# Patient Record
Sex: Female | Born: 1945 | Race: White | Hispanic: No | Marital: Married | Smoking: Never smoker
Health system: Southern US, Community
[De-identification: ages and names within clinical notes are randomized; demographics above are authoritative.]

## PROBLEM LIST (undated history)

## (undated) DIAGNOSIS — Z800 Family history of malignant neoplasm of digestive organs: Secondary | ICD-10-CM

## (undated) DIAGNOSIS — L719 Rosacea, unspecified: Secondary | ICD-10-CM

## (undated) DIAGNOSIS — C50911 Malignant neoplasm of unspecified site of right female breast: Secondary | ICD-10-CM

## (undated) DIAGNOSIS — I10 Essential (primary) hypertension: Secondary | ICD-10-CM

## (undated) DIAGNOSIS — C541 Malignant neoplasm of endometrium: Secondary | ICD-10-CM

## (undated) HISTORY — DX: Family history of malignant neoplasm of digestive organs: Z80.0

---

## 1973-01-06 HISTORY — PX: DILATION AND CURETTAGE OF UTERUS: SHX78

## 2005-01-06 HISTORY — PX: BREAST BIOPSY: SHX20

## 2014-01-06 DIAGNOSIS — C541 Malignant neoplasm of endometrium: Secondary | ICD-10-CM

## 2014-01-06 HISTORY — DX: Malignant neoplasm of endometrium: C54.1

## 2014-04-07 HISTORY — PX: ROBOTIC ASSISTED TOTAL HYSTERECTOMY WITH BILATERAL SALPINGO OOPHERECTOMY: SHX6086

## 2016-12-06 HISTORY — PX: BREAST BIOPSY: SHX20

## 2017-01-14 ENCOUNTER — Other Ambulatory Visit: Payer: Self-pay | Admitting: Surgery

## 2017-01-14 DIAGNOSIS — N6310 Unspecified lump in the right breast, unspecified quadrant: Secondary | ICD-10-CM

## 2017-01-15 ENCOUNTER — Other Ambulatory Visit: Payer: Self-pay

## 2017-01-16 ENCOUNTER — Other Ambulatory Visit: Payer: Self-pay

## 2017-01-16 ENCOUNTER — Ambulatory Visit
Admission: RE | Admit: 2017-01-16 | Discharge: 2017-01-16 | Disposition: A | Discharge status: Home / Self Care | Payer: Medicare Other | Source: Ambulatory Visit | Attending: Surgery | Admitting: Surgery

## 2017-01-16 DIAGNOSIS — N6310 Unspecified lump in the right breast, unspecified quadrant: Secondary | ICD-10-CM

## 2017-01-16 MED ORDER — GADOBENATE DIMEGLUMINE 529 MG/ML IV SOLN
15.0000 mL | Freq: Once | INTRAVENOUS | Status: AC | PRN
  Administered 2017-01-16: 15 mL via INTRAVENOUS

## 2017-01-19 ENCOUNTER — Ambulatory Visit: Payer: Self-pay | Admitting: Surgery

## 2017-01-19 DIAGNOSIS — C50911 Malignant neoplasm of unspecified site of right female breast: Secondary | ICD-10-CM

## 2017-01-19 NOTE — H&P (Signed)
Doris Maynard Documented: 01/19/2017 2:21 PM Location: [facility] Patient #: [id number] DOB: 01/03/1946 Married / Language: Cleophus Molt / Race: White Female  History of Present Illness Marcello Moores A. Aidian Salomon MD; 01/19/2017 4:00 PM) Patient words: Patient sent at the request of Dr. Hinda Kehr for abnormal screening mammogram. Patient noted to have 2 densities in the right breast in the upper outer and upper quadrants. 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 The area in the right upper-outer quadrant breast measures 3 cm the area in the right upper inner quadrant measures 2 cm. There are 2 other satellite nodules adjacent to these areas. Left breast was normal. Patient has no family history of breast cancer. Patient denies any history of nipple discharge breast mass or breast pain.                       CLINICAL DATA: Recent diagnosis of right breast cancer at 2 sites, 1 o'clock axis (mass) and 10 o'clock axis (mass with distortion).  History of benign left breast biopsy in 2007, fibroadenoma.  LABS: Creatinine 0.9, GFR 65  EXAM: BILATERAL BREAST MRI WITH AND WITHOUT CONTRAST  TECHNIQUE: Multiplanar, multisequence MR images of both breasts were obtained prior to and following the intravenous administration of 15 ml of MultiHance.  THREE-DIMENSIONAL MR IMAGE RENDERING ON INDEPENDENT WORKSTATION:  Three-dimensional MR images were rendered by post-processing of the original MR data on an independent workstation. The three-dimensional MR images were interpreted, and findings are reported in the following complete MRI report for this study. Three dimensional images were evaluated at the independent DynaCad workstation  COMPARISON: Previous exams including diagnostic mammogram and ultrasound dated 12/19/2016, ultrasound guided biopsy dated 01/01/2017  and postprocedure mammogram dated 01/01/2017.  FINDINGS: Breast composition: c. Heterogeneous fibroglandular tissue.  Background parenchymal enhancement: Moderate.  Right breast: Biopsy-proven invasive mammary carcinoma with associated distortion in the upper-outer quadrant of the right breast, 10 o'clock axis by ultrasound, measuring 3.3 x 0.9 cm (AP by transverse dimensions), with associated biopsy clip artifact (series 6, images 78 through 86).  Second biopsy-proven invasive carcinoma within the upper inner quadrant of the right breast, 1 o'clock axis by ultrasound, measuring 2 x 1.4 cm, with associated biopsy clip(series 6, images 58 through 64).  Additional irregular enhancing mass within the slightly inner right breast, at middle depth, measuring 5 mm, with persistent enhancement kinetics, located approximately 1.5 cm medial to the biopsy-proven cancer at the 10 o'clock axis, suspected satellite lesion.  Additional irregular enhancing mass within the upper inner quadrant of the right breast, at anterior depth, 2-3 o'clock axis, measuring 7 mm, with persistent enhancement kinetics (series 6, image 83).  Left breast: No suspicious enhancing mass, non mass enhancement or secondary signs of malignancy are identified within the left breast.  Lymph nodes: No enlarged or morphologically abnormal lymph nodes are seen within either axillary region or internal mammary chain regions.  Ancillary findings: None.  IMPRESSION: 1. Biopsy-proven multicentric invasive carcinomas within the RIGHT breast at the 10 o'clock and 1 o'clock axes, measuring 3.3 cm and 2 cm respectively, with associated biopsy clip artifacts. 2. Additional suspicious enhancing mass within the slightly inner RIGHT breast, at middle depth, measuring 5 mm, located approximately 1.5 cm medial to the biopsy-proven cancer at the 10 o'clock axis, suspected satellite lesion. If breast conservation surgery  is considered for the right breast, recommend MRI guided biopsy of  this mass. 3. Additional suspicious enhancing mass within the upper inner quadrant of the RIGHT breast, at anterior depth, 2-3 o'clock axis, measuring 7 mm. If breast conservation surgery is considered for the right breast, recommend MRI guided biopsy of this mass as well. 4. No evidence of malignancy within the LEFT breast.  RECOMMENDATION: If breast conservation is considered for the RIGHT breast, recommend additional MRI guided biopsies of the 2 additional masses within the RIGHT breast, as detailed above, which are suspicious for additional multicentric disease.  BI-RADS CATEGORY 4: Suspicious.   Electronically Signed By: Franki Cabot M.D. On: 01/19/2017 08:57.  The patient is a 72 year old female.   Past Surgical History (Janette Ranson, CMA; 01/19/2017 2:21 PM) Hysterectomy (due to cancer) - Complete  Diagnostic Studies History (Janette Ranson, CMA; 01/19/2017 2:21 PM) Colonoscopy 1-5 years ago Mammogram within last year  Allergies (Janette Ranson, CMA; 01/19/2017 2:22 PM) Clindamycin HCl *ANTI-INFECTIVE AGENTS - MISC.*  Medication History (Janette Ranson, CMA; 01/19/2017 2:24 PM) Lisinopril (20MG Tablet, Oral) Active. MetroNIDAZOLE (0.75% Cream, External) Active.  Pregnancy / Birth History (Janette Ranson, CMA; 01/19/2017 2:21 PM) Age at menarche 31 years. Age of menopause 51-55 Contraceptive History Oral contraceptives. Gravida 3 Length (months) of breastfeeding 3-6 Maternal age 60-30 Para 2  Other Problems (Janette Ranson, CMA; 01/19/2017 2:21 PM) Cancer High blood pressure     Review of Systems (Janette Ranson CMA; 01/19/2017 2:21 PM) General Not Present- Appetite Loss, Chills, Fatigue, Fever, Night Sweats, Weight Gain and Weight Loss. Skin Not Present- Change in Wart/Mole, Dryness, Hives, Jaundice, New Lesions, Non-Healing Wounds, Rash and Ulcer. HEENT Not Present-  Earache, Hearing Loss, Hoarseness, Nose Bleed, Oral Ulcers, Ringing in the Ears, Seasonal Allergies, Sinus Pain, Sore Throat, Visual Disturbances, Wears glasses/contact lenses and Yellow Eyes. Respiratory Not Present- Bloody sputum, Chronic Cough, Difficulty Breathing, Snoring and Wheezing. Breast Not Present- Breast Mass, Breast Pain, Nipple Discharge and Skin Changes. Cardiovascular Not Present- Chest Pain, Difficulty Breathing Lying Down, Leg Cramps, Palpitations, Rapid Heart Rate, Shortness of Breath and Swelling of Extremities. Gastrointestinal Not Present- Abdominal Pain, Bloating, Bloody Stool, Change in Bowel Habits, Chronic diarrhea, Constipation, Difficulty Swallowing, Excessive gas, Gets full quickly at meals, Hemorrhoids, Indigestion, Nausea, Rectal Pain and Vomiting. Female Genitourinary Not Present- Frequency, Nocturia, Painful Urination, Pelvic Pain and Urgency. Neurological Not Present- Decreased Memory, Fainting, Headaches, Numbness, Seizures, Tingling, Tremor, Trouble walking and Weakness. Psychiatric Not Present- Anxiety, Bipolar, Change in Sleep Pattern, Depression, Fearful and Frequent crying. Endocrine Not Present- Cold Intolerance, Excessive Hunger, Hair Changes, Heat Intolerance, Hot flashes and New Diabetes. Hematology Not Present- Blood Thinners, Easy Bruising, Excessive bleeding, Gland problems, HIV and Persistent Infections.  Vitals (Janette Ranson CMA; 01/19/2017 2:25 PM) 01/19/2017 2:24 PM Weight: 169 lb Height: 65in Body Surface Area: 1.84 m Body Mass Index: 28.12 kg/m  Pulse: 118 (Regular)  BP: 128/84 (Sitting, Left Arm, Standard)      Physical Exam (Bryann Mcnealy A. Ruvi Fullenwider MD; 01/19/2017 4:01 PM)  General Mental Status-Alert. General Appearance-Consistent with stated age. Hydration-Well hydrated. Voice-Normal.  Head and Neck Head-normocephalic, atraumatic with no lesions or palpable masses. Trachea-midline. Thyroid Gland Characteristics  - normal size and consistency.  Eye Eyeball - Bilateral-Extraocular movements intact. Sclera/Conjunctiva - Bilateral-No scleral icterus.  Chest and Lung Exam Chest and lung exam reveals -quiet, even and easy respiratory effort with no use of accessory muscles and on auscultation, normal breath sounds, no adventitious sounds and normal vocal resonance. Inspection Chest Wall - Normal. Back - normal.  Breast Breast - Left-Symmetric, Non Tender,  No Biopsy scars, no Dimpling, No Inflammation, No Lumpectomy scars, No Mastectomy scars, No Peau d' Orange. Breast - Right-Symmetric, Non Tender, No Biopsy scars, no Dimpling, No Inflammation, No Lumpectomy scars, No Mastectomy scars, No Peau d' Orange. Breast Lump-No Palpable Breast Mass. Note: bruising right breast minimal  Cardiovascular Cardiovascular examination reveals -normal heart sounds, regular rate and rhythm with no murmurs and normal pedal pulses bilaterally.  Abdomen Inspection Inspection of the abdomen reveals - No Hernias. Skin - Scar - no surgical scars. Palpation/Percussion Palpation and Percussion of the abdomen reveal - Soft, Non Tender, No Rebound tenderness, No Rigidity (guarding) and No hepatosplenomegaly. Auscultation Auscultation of the abdomen reveals - Bowel sounds normal.  Neurologic Neurologic evaluation reveals -alert and oriented x 3 with no impairment of recent or remote memory. Mental Status-Normal.  Musculoskeletal Normal Exam - Left-Upper Extremity Strength Normal and Lower Extremity Strength Normal. Normal Exam - Right-Upper Extremity Strength Normal and Lower Extremity Strength Normal.  Lymphatic Head & Neck  General Head & Neck Lymphatics: Bilateral - Description - Normal. Axillary  General Axillary Region: Bilateral - Description - Normal. Tenderness - Non Tender. Femoral & Inguinal  Generalized Femoral & Inguinal Lymphatics: Bilateral - Description - Normal. Tenderness -  Non Tender.    Assessment & Plan (Brean Carberry A. Jules Vidovich MD; 01/19/2017 4:01 PM)  BREAST CANCER, STAGE 2, RIGHT (C50.911) Impression: I'll plan for right breast cancer. This appears to be globular. 2 lumpectomies would deplete at least half of her breast volume and cosmetically would be difficult. Did Discussed treatment options for breast cancer to include breast conservation vs mastectomy with reconstruction. Pt has decided on mastectomy. Risk include bleeding, infection, flap necrosis, pain, numbness, recurrence, hematoma, other surgery needs. Pt understands and agrees to proceed. Risk of sentinel lymph node mapping include bleeding, infection, lymphedema, shoulder pain. stiffness, dye allergy. cosmetic deformity , blood clots, death, need for more surgery. Pt agres to proceed. offer reconstruction but she has no interest. The patient would like to proceed with right simple mastectomy with lymph node mapping.  Current Plans You are being scheduled for surgery- Our schedulers will call you.  You should hear from our office's scheduling department within 5 working days about the location, date, and time of surgery. We try to make accommodations for patient's preferences in scheduling surgery, but sometimes the OR schedule or the surgeon's schedule prevents [place] from making those accommodations.  If you have not heard from our office [phone]) in 5 working days, call the office and ask for your surgeon's nurse.  If you have other questions about your diagnosis, plan, or surgery, call the office and ask for your surgeon's nurse.  Pt Education - [facility] Breast Cancer Information Given - [facility] "Breast Journey" Package Pt Education - [facility] Mastectomy HCI Pt Education - breast cancer surgery: discussed with patient and provided information. Pt Education - [facility] Mastectomy HCI

## 2017-01-19 NOTE — H&P (View-Only) (Signed)
Doris Maynard Documented: 01/19/2017 2:21 PM Location: [facility] Patient #: [id number] DOB: 10/17/1945 Married / Language: English / Race: White Female  History of Present Illness (Doris Maynard A. Doris Kirtz MD; 01/19/2017 4:00 PM) Patient words: Patient sent at the request of Dr. Michelle Maynard for abnormal screening mammogram. Patient noted to have 2 densities in the right breast in the upper outer and upper quadrants. 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 The area in the right upper-outer quadrant breast measures 3 cm the area in the right upper inner quadrant measures 2 cm. There are 2 other satellite nodules adjacent to these areas. Left breast was normal. Patient has no family history of breast cancer. Patient denies any history of nipple discharge breast mass or breast pain.                       CLINICAL DATA: Recent diagnosis of right breast cancer at 2 sites, 1 o'clock axis (mass) and 10 o'clock axis (mass with distortion).  History of benign left breast biopsy in 2007, fibroadenoma.  LABS: Creatinine 0.9, GFR 65  EXAM: BILATERAL BREAST MRI WITH AND WITHOUT CONTRAST  TECHNIQUE: Multiplanar, multisequence MR images of both breasts were obtained prior to and following the intravenous administration of 15 ml of MultiHance.  THREE-DIMENSIONAL MR IMAGE RENDERING ON INDEPENDENT WORKSTATION:  Three-dimensional MR images were rendered by post-processing of the original MR data on an independent workstation. The three-dimensional MR images were interpreted, and findings are reported in the following complete MRI report for this study. Three dimensional images were evaluated at the independent DynaCad workstation  COMPARISON: Previous exams including diagnostic mammogram and ultrasound dated 12/19/2016, ultrasound guided biopsy dated 01/01/2017  and postprocedure mammogram dated 01/01/2017.  FINDINGS: Breast composition: c. Heterogeneous fibroglandular tissue.  Background parenchymal enhancement: Moderate.  Right breast: Biopsy-proven invasive mammary carcinoma with associated distortion in the upper-outer quadrant of the right breast, 10 o'clock axis by ultrasound, measuring 3.3 x 0.9 cm (AP by transverse dimensions), with associated biopsy clip artifact (series 6, images 78 through 86).  Second biopsy-proven invasive carcinoma within the upper inner quadrant of the right breast, 1 o'clock axis by ultrasound, measuring 2 x 1.4 cm, with associated biopsy clip(series 6, images 58 through 64).  Additional irregular enhancing mass within the slightly inner right breast, at middle depth, measuring 5 mm, with persistent enhancement kinetics, located approximately 1.5 cm medial to the biopsy-proven cancer at the 10 o'clock axis, suspected satellite lesion.  Additional irregular enhancing mass within the upper inner quadrant of the right breast, at anterior depth, 2-3 o'clock axis, measuring 7 mm, with persistent enhancement kinetics (series 6, image 83).  Left breast: No suspicious enhancing mass, non mass enhancement or secondary signs of malignancy are identified within the left breast.  Lymph nodes: No enlarged or morphologically abnormal lymph nodes are seen within either axillary region or internal mammary chain regions.  Ancillary findings: None.  IMPRESSION: 1. Biopsy-proven multicentric invasive carcinomas within the RIGHT breast at the 10 o'clock and 1 o'clock axes, measuring 3.3 cm and 2 cm respectively, with associated biopsy clip artifacts. 2. Additional suspicious enhancing mass within the slightly inner RIGHT breast, at middle depth, measuring 5 mm, located approximately 1.5 cm medial to the biopsy-proven cancer at the 10 o'clock axis, suspected satellite lesion. If breast conservation surgery  is considered for the right breast, recommend MRI guided biopsy of   this mass. 3. Additional suspicious enhancing mass within the upper inner quadrant of the RIGHT breast, at anterior depth, 2-3 o'clock axis, measuring 7 mm. If breast conservation surgery is considered for the right breast, recommend MRI guided biopsy of this mass as well. 4. No evidence of malignancy within the LEFT breast.  RECOMMENDATION: If breast conservation is considered for the RIGHT breast, recommend additional MRI guided biopsies of the 2 additional masses within the RIGHT breast, as detailed above, which are suspicious for additional multicentric disease.  BI-RADS CATEGORY 4: Suspicious.   Electronically Signed By: Doris Maynard M.D. On: 01/19/2017 08:57.  The patient is a 71 year old female.   Past Surgical History (Doris Maynard, CMA; 01/19/2017 2:21 PM) Hysterectomy (due to cancer) - Complete  Diagnostic Studies History (Doris Maynard, CMA; 01/19/2017 2:21 PM) Colonoscopy 1-5 years ago Mammogram within last year  Allergies (Doris Maynard, CMA; 01/19/2017 2:22 PM) Clindamycin HCl *ANTI-INFECTIVE AGENTS - MISC.*  Medication History (Doris Maynard, CMA; 01/19/2017 2:24 PM) Lisinopril (20MG Tablet, Oral) Active. MetroNIDAZOLE (0.75% Cream, External) Active.  Pregnancy / Birth History (Doris Maynard, CMA; 01/19/2017 2:21 PM) Age at menarche 17 years. Age of menopause 51-55 Contraceptive History Oral contraceptives. Gravida 3 Length (months) of breastfeeding 3-6 Maternal age 26-30 Para 2  Other Problems (Doris Maynard, CMA; 01/19/2017 2:21 PM) Cancer High blood pressure     Review of Systems (Doris Maynard CMA; 01/19/2017 2:21 PM) General Not Present- Appetite Loss, Chills, Fatigue, Fever, Night Sweats, Weight Gain and Weight Loss. Skin Not Present- Change in Wart/Mole, Dryness, Hives, Jaundice, New Lesions, Non-Healing Wounds, Rash and Ulcer. HEENT Not Present-  Earache, Hearing Loss, Hoarseness, Nose Bleed, Oral Ulcers, Ringing in the Ears, Seasonal Allergies, Sinus Pain, Sore Throat, Visual Disturbances, Wears glasses/contact lenses and Yellow Eyes. Respiratory Not Present- Bloody sputum, Chronic Cough, Difficulty Breathing, Snoring and Wheezing. Breast Not Present- Breast Mass, Breast Pain, Nipple Discharge and Skin Changes. Cardiovascular Not Present- Chest Pain, Difficulty Breathing Lying Down, Leg Cramps, Palpitations, Rapid Heart Rate, Shortness of Breath and Swelling of Extremities. Gastrointestinal Not Present- Abdominal Pain, Bloating, Bloody Stool, Change in Bowel Habits, Chronic diarrhea, Constipation, Difficulty Swallowing, Excessive gas, Gets full quickly at meals, Hemorrhoids, Indigestion, Nausea, Rectal Pain and Vomiting. Female Genitourinary Not Present- Frequency, Nocturia, Painful Urination, Pelvic Pain and Urgency. Neurological Not Present- Decreased Memory, Fainting, Headaches, Numbness, Seizures, Tingling, Tremor, Trouble walking and Weakness. Psychiatric Not Present- Anxiety, Bipolar, Change in Sleep Pattern, Depression, Fearful and Frequent crying. Endocrine Not Present- Cold Intolerance, Excessive Hunger, Hair Changes, Heat Intolerance, Hot flashes and New Diabetes. Hematology Not Present- Blood Thinners, Easy Bruising, Excessive bleeding, Gland problems, HIV and Persistent Infections.  Vitals (Doris Maynard CMA; 01/19/2017 2:25 PM) 01/19/2017 2:24 PM Weight: 169 lb Height: 65in Body Surface Area: 1.84 m Body Mass Index: 28.12 kg/m  Pulse: 118 (Regular)  BP: 128/84 (Sitting, Left Arm, Standard)      Physical Exam (Lucianne Smestad A. Brytnee Bechler MD; 01/19/2017 4:01 PM)  General Mental Status-Alert. General Appearance-Consistent with stated age. Hydration-Well hydrated. Voice-Normal.  Head and Neck Head-normocephalic, atraumatic with no lesions or palpable masses. Trachea-midline. Thyroid Gland Characteristics  - normal size and consistency.  Eye Eyeball - Bilateral-Extraocular movements intact. Sclera/Conjunctiva - Bilateral-No scleral icterus.  Chest and Lung Exam Chest and lung exam reveals -quiet, even and easy respiratory effort with no use of accessory muscles and on auscultation, normal breath sounds, no adventitious sounds and normal vocal resonance. Inspection Chest Wall - Normal. Back - normal.  Breast Breast - Left-Symmetric, Non Tender,   No Biopsy scars, no Dimpling, No Inflammation, No Lumpectomy scars, No Mastectomy scars, No Peau d' Orange. Breast - Right-Symmetric, Non Tender, No Biopsy scars, no Dimpling, No Inflammation, No Lumpectomy scars, No Mastectomy scars, No Peau d' Orange. Breast Lump-No Palpable Breast Mass. Note: bruising right breast minimal  Cardiovascular Cardiovascular examination reveals -normal heart sounds, regular rate and rhythm with no murmurs and normal pedal pulses bilaterally.  Abdomen Inspection Inspection of the abdomen reveals - No Hernias. Skin - Scar - no surgical scars. Palpation/Percussion Palpation and Percussion of the abdomen reveal - Soft, Non Tender, No Rebound tenderness, No Rigidity (guarding) and No hepatosplenomegaly. Auscultation Auscultation of the abdomen reveals - Bowel sounds normal.  Neurologic Neurologic evaluation reveals -alert and oriented x 3 with no impairment of recent or remote memory. Mental Status-Normal.  Musculoskeletal Normal Exam - Left-Upper Extremity Strength Normal and Lower Extremity Strength Normal. Normal Exam - Right-Upper Extremity Strength Normal and Lower Extremity Strength Normal.  Lymphatic Head & Neck  General Head & Neck Lymphatics: Bilateral - Description - Normal. Axillary  General Axillary Region: Bilateral - Description - Normal. Tenderness - Non Tender. Femoral & Inguinal  Generalized Femoral & Inguinal Lymphatics: Bilateral - Description - Normal. Tenderness -  Non Tender.    Assessment & Plan (Jalon Blackwelder A. Elvyn Krohn MD; 01/19/2017 4:01 PM)  BREAST CANCER, STAGE 2, RIGHT (C50.911) Impression: I'll plan for right breast cancer. This appears to be globular. 2 lumpectomies would deplete at least half of her breast volume and cosmetically would be difficult. Did Discussed treatment options for breast cancer to include breast conservation vs mastectomy with reconstruction. Pt has decided on mastectomy. Risk include bleeding, infection, flap necrosis, pain, numbness, recurrence, hematoma, other surgery needs. Pt understands and agrees to proceed. Risk of sentinel lymph node mapping include bleeding, infection, lymphedema, shoulder pain. stiffness, dye allergy. cosmetic deformity , blood clots, death, need for more surgery. Pt agres to proceed. offer reconstruction but she has no interest. The patient would like to proceed with right simple mastectomy with lymph node mapping.  Current Plans You are being scheduled for surgery- Our schedulers will call you.  You should hear from our office's scheduling department within 5 working days about the location, date, and time of surgery. We try to make accommodations for patient's preferences in scheduling surgery, but sometimes the OR schedule or the surgeon's schedule prevents [place] from making those accommodations.  If you have not heard from our office [phone]) in 5 working days, call the office and ask for your surgeon's nurse.  If you have other questions about your diagnosis, plan, or surgery, call the office and ask for your surgeon's nurse.  Pt Education - [facility] Breast Cancer Information Given - [employer] "Breast Journey" Package Pt Education - [facility] Mastectomy HCI Pt Education - breast cancer surgery: discussed with patient and provided information. Pt Education - [facility] Mastectomy HCI

## 2017-01-22 ENCOUNTER — Telehealth: Payer: Self-pay | Admitting: Oncology

## 2017-01-22 NOTE — Telephone Encounter (Signed)
Spoke with patient regarding appointment Date/Time/Location/phone#.  She also has questions as to why she is seeing ONC prior to surgery.  She was told by [facility] @ [facility] that she would not see ONC until after surgery.  I sent in basket message to Iris Pert and advised her to please call patient and explain reason for appointment prior to surgery

## 2017-01-26 ENCOUNTER — Telehealth: Payer: Self-pay | Admitting: Oncology

## 2017-01-26 NOTE — Telephone Encounter (Signed)
Patient called in trying to get an earlier apppointment

## 2017-01-27 ENCOUNTER — Other Ambulatory Visit: Payer: Self-pay

## 2017-01-27 ENCOUNTER — Telehealth: Payer: Self-pay | Admitting: Oncology

## 2017-01-27 ENCOUNTER — Encounter (HOSPITAL_COMMUNITY)
Admission: RE | Admit: 2017-01-27 | Discharge: 2017-01-27 | Disposition: A | Discharge status: Home / Self Care | Payer: Medicare Other | Source: Ambulatory Visit | Attending: Surgery | Admitting: Surgery

## 2017-01-27 ENCOUNTER — Encounter (HOSPITAL_COMMUNITY): Payer: Self-pay

## 2017-01-27 DIAGNOSIS — C50911 Malignant neoplasm of unspecified site of right female breast: Secondary | ICD-10-CM

## 2017-01-27 DIAGNOSIS — Z01812 Encounter for preprocedural laboratory examination: Secondary | ICD-10-CM | POA: Diagnosis not present

## 2017-01-27 HISTORY — DX: Rosacea, unspecified: L71.9

## 2017-01-27 HISTORY — DX: Essential (primary) hypertension: I10

## 2017-01-27 LAB — BASIC METABOLIC PANEL
ANION GAP: 13 (ref 5–15)
BUN: 12 mg/dL (ref 6–20)
CO2: 22 mmol/L (ref 22–32)
Calcium: 9.5 mg/dL (ref 8.9–10.3)
Chloride: 104 mmol/L (ref 101–111)
Creatinine, Ser: 0.83 mg/dL (ref 0.44–1.00)
GLUCOSE: 100 mg/dL — AB (ref 65–99)
POTASSIUM: 4.2 mmol/L (ref 3.5–5.1)
Sodium: 139 mmol/L (ref 135–145)

## 2017-01-27 LAB — CBC
HEMATOCRIT: 43.7 % (ref 36.0–46.0)
Hemoglobin: 14.4 g/dL (ref 12.0–15.0)
MCH: 30.2 pg (ref 26.0–34.0)
MCHC: 33 g/dL (ref 30.0–36.0)
MCV: 91.6 fL (ref 78.0–100.0)
Platelets: 237 10*3/uL (ref 150–400)
RBC: 4.77 MIL/uL (ref 3.87–5.11)
RDW: 12 % (ref 11.5–15.5)
WBC: 5.7 10*3/uL (ref 4.0–10.5)

## 2017-01-27 MED ORDER — CHLORHEXIDINE GLUCONATE CLOTH 2 % EX PADS: 6.0000 | MEDICATED_PAD | Freq: Once | CUTANEOUS | Status: DC

## 2017-01-27 NOTE — Pre-Procedure Instructions (Addendum)
Doris Maynard  01/27/2017      [facility] - [place], [place] - [address] [place] [address] Phone: [phone] Fax: [phone]    Your procedure is scheduled on Thursday, Jan. 31st   Report to [facility] Admitting at 9:30AM.             (posted surgery time 11:30 am - 1:08 pm)   Call this number if you have problems the morning of surgery:  [phone]   Remember:              Exception to the rule -- please drink the Pre Surgery drink just prior to leaving the house to coming to the hospital.   Do not eat food or drink liquids after midnight, Wednesday.              4-5 days prior to surgery, STOP TAKING any Vitamins, Herbal Supplements, Anti-inflammatories, Blood thinners.   Take these medicines the morning of surgery - Systane eye drops   Do not wear jewelry, make-up or nail polish.  Do not wear lotions, powders, perfumes, or deodorant.  Do not shave 48 hours prior to surgery.     Do not bring valuables to the hospital.  [facility] is not responsible for any belongings or valuables.  Contacts, dentures or bridgework may not be worn into surgery.  Leave your suitcase in the car.  After surgery it may be brought to your room.  For patients admitted to the hospital, discharge time will be determined by your treatment team.  Patients discharged the day of surgery will not be allowed to drive home, and will need someone to stay with you for the first 24 hrs.  Please read over the following fact sheets that you were given. Pain Booklet and Surgical Site Infection Prevention     [facility]- Preparing For Surgery  Before surgery, you can play an important role. Because skin is not sterile, your skin needs to be as free of germs as possible. You can reduce the number of germs on your skin by washing with CHG (chlorahexidine gluconate) Soap before surgery.  CHG is an antiseptic cleaner which kills germs  and bonds with the skin to continue killing germs even after washing.  Please do not use if you have an allergy to CHG or antibacterial soaps. If your skin becomes reddened/irritated stop using the CHG.  Do not shave (including legs and underarms) for at least 48 hours prior to first CHG shower. It is OK to shave your face.  Please follow these instructions carefully.   1. Shower the NIGHT BEFORE SURGERY and the MORNING OF SURGERY with CHG.   2. If you chose to wash your hair, wash your hair first as usual with your normal shampoo.  3. After you shampoo, rinse your hair and body thoroughly to remove the shampoo.  4. Use CHG as you would any other liquid soap. You can apply CHG directly to the skin and wash gently with a scrungie or a clean washcloth.   5. Apply the CHG Soap to your body ONLY FROM THE NECK DOWN.  Do not use on open wounds or open sores. Avoid contact with your eyes, ears, mouth and genitals (private parts). Wash Face and genitals (private parts)  with your normal soap.  6. Wash thoroughly, paying special attention to the area where your surgery will be performed.  7. Thoroughly rinse your body with  warm water from the neck down.  8. DO NOT shower/wash with your normal soap after using and rinsing off the CHG Soap.  9. Pat yourself dry with a CLEAN TOWEL.  10. Wear CLEAN PAJAMAS to bed the night before surgery, wear comfortable clothes the morning of surgery  11. Place CLEAN SHEETS on your bed the night of your first shower and DO NOT SLEEP WITH PETS.    Day of Surgery: Do not apply any deodorants/lotions. Please wear clean clothes to the hospital/surgery center.

## 2017-01-27 NOTE — Progress Notes (Addendum)
PCP is Dr. Karle Starch  LOV 07/2016 Soon to be Oncologist is Dr. Shelda Pal. Denies any cardiac issues, no cp, sob, murmur, no echo's

## 2017-02-02 ENCOUNTER — Inpatient Hospital Stay: Payer: Medicare Other | Attending: Oncology | Admitting: Oncology

## 2017-02-02 ENCOUNTER — Other Ambulatory Visit: Payer: Medicare Other

## 2017-02-02 DIAGNOSIS — I10 Essential (primary) hypertension: Secondary | ICD-10-CM | POA: Insufficient documentation

## 2017-02-02 DIAGNOSIS — C50811 Malignant neoplasm of overlapping sites of right female breast: Secondary | ICD-10-CM | POA: Insufficient documentation

## 2017-02-02 DIAGNOSIS — Z170 Estrogen receptor positive status [ER+]: Secondary | ICD-10-CM | POA: Diagnosis not present

## 2017-02-02 DIAGNOSIS — Z8542 Personal history of malignant neoplasm of other parts of uterus: Secondary | ICD-10-CM | POA: Insufficient documentation

## 2017-02-02 DIAGNOSIS — Z90710 Acquired absence of both cervix and uterus: Secondary | ICD-10-CM | POA: Diagnosis not present

## 2017-02-02 DIAGNOSIS — Z79899 Other long term (current) drug therapy: Secondary | ICD-10-CM | POA: Insufficient documentation

## 2017-02-02 DIAGNOSIS — L719 Rosacea, unspecified: Secondary | ICD-10-CM | POA: Diagnosis not present

## 2017-02-02 NOTE — Progress Notes (Signed)
[facility]  [phone] Fax:([phone]) [phone]     ID: Doris Maynard DOB: [birth year]  MR#: [id number]  YQM#:[id number]  Patient Care Team: Kristopher Glee., MD as PCP - General (Internal Medicine) Erroll Luna, MD as Consulting Physician (General Surgery) Magrinat, Virgie Dad, MD as Consulting Physician (Oncology) Maralyn Sago, MD as Referring Physician (Oncology) OTHER MD:  CHIEF COMPLAINT:   CURRENT TREATMENT:    HISTORY OF CURRENT ILLNESS: Doris Maynard had routine screening mammography in [facility] on 12/12/2016 showing a possible abnormality in the right breast. She underwent unilateral right diagnostic mammography with tomography and right breast ultrasonography in [facility] on 12/19/2016 showing: breast density category C. Suspicious irregular hypoechoic mass right breast 10 o'clock upper outer position 6 cm from the nipple measuring 1.2 x 0.6 x 0.7 cm, felt to correspond with focal area of distortion on mammography. Additional suspicious right breast mass 1 o'clock upper inner position 4 cm from the nipple measuring 0.9 x 0.7 x 0.7 cm. Multiple cortically thickened right axillary lymph nodes.   Accordingly on 01/01/2017 she proceeded to biopsy of the right breast masses along with lymph node sampling . The pathology from this procedure showed ([id number])  Right breast 10 o'clock position: Invasive mammary carcinoma grade II. At the 1 o'clock position: Invasive mammary carcinoma grade II. E-cadherin stain shows diffuse but weak reactivity amongst the cells, favoring lobular carcinoma. Right axillary lymph node: No evidence of malignancy. Estrogen receptor positive at 99%, progesterone receptor positive at 99%, both with strong positivity, HER-2 not amplified by immunohistochemistry (1+).  She also completed a bilateral breast MRI on 01/16/2017 showing: Biopsy-proven multicentric invasive carcinomas within the RIGHT breast at the 10 o'clock and  1 o'clock axes, measuring 3.3 cm and 2 cm respectively, with associated biopsy clip artifacts. Additional suspicious enhancing mass within the slightly inner RIGHT breast, at middle depth, measuring 5 mm, located approximately 1.5 cm medial to the biopsy-proven cancer at the 10 o'clock axis, suspected satellite lesion. Additional suspicious enhancing mass within the upper inner quadrant of the RIGHT breast, at anterior depth, 2-3 o'clock axis, measuring 7 mm. No evidence of malignancy within the LEFT breast.  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  The patient's subsequent history is as detailed below.  INTERVAL HISTORY: Doris Maynard was evaluated in the breast cancer clinic on 02/03/2016 accompanied by her husbandBob.  REVIEW OF SYSTEMS: Doris Maynard reports that she is well overall. She notes that she is hoping to get a mastectomy of the right breast. She notes that the surgeon told her that because of the masses in her breast, lumpectomy would yield for her breast to be distorted and uneven. There were no specific symptoms leading to the original mammogram, which was routinely scheduled. The patient denies unusual headaches, visual changes, nausea, vomiting, stiff neck, dizziness, or gait imbalance. There has been no cough, phlegm production, or pleurisy, no chest pain or pressure, and no change in bowel or bladder habits. The patient denies fever, rash, bleeding, unexplained fatigue or unexplained  weight loss. A detailed review of systems was otherwise  entirely negative. Marland Kitchen    PAST MEDICAL HISTORY: Past Medical History:  Diagnosis Date  . Cancer [facility])    2016 endometrial cancer -- hysterectomy  . Hypertension   . Rosacea    mainly on face  Hysterectomy with BSO with Dr. Claiborne Billings.   PAST SURGICAL HISTORY: Past Surgical History:  Procedure Laterality Date  . ABDOMINAL HYSTERECTOMY    . BREAST SURGERY     2007 had core biopsy   . miscarriage     1974    FAMILY HISTORY No family history on file.  She notes that her father died of colon cancer at age 73. Her mother died at age 72 with a neck condition. She had 1 brother who died at age 70with  dementia and she has no sisters. She denies a family history of ovarian or breast cancer.   GYNECOLOGIC HISTORY:  No LMP recorded. Patient has had a hysterectomy. Menarche: 72 years old Age at first live birth: 72 years old GXP2 Contraceptive: birth control pills in her 35's and 40'J with no complications HRT: No Hysterectomy with BSO in 2016   SOCIAL HISTORY:  She is a retired Engineer, agricultural as a second Equities trader. Her husband, Doris Maynard, is retired from the [employer]. She has 2 children, her oldest is daughter, Doris Maynard who is 30 and a Freight forwarder for [employer] in [place]. Her youngest son, Doris Maynard age 53  is an attorney in [place], [place]. She has 2 grandchildren who both live in [place]. She goes to a [employer] in [place], [place]     ADVANCED DIRECTIVES:   HEALTH MAINTENANCE: Social History   Tobacco Use  . Smoking status: Never Smoker  . Smokeless tobacco: Never Used  Substance Use Topics  . Alcohol use: Yes    Comment: will drink wine once or twice a month  . Drug use: No     Colonoscopy: 10/30/2014  PAP: status post hysterectomy  Bone density:12/12/2016 T score -3.2 osteoporosis   Allergies  Allergen Reactions  . Clindamycin Diarrhea    Current Outpatient Medications  Medication Sig Dispense Refill    . lisinopril (PRINIVIL,ZESTRIL) 20 MG tablet Take 20 mg by mouth daily.    . metroNIDAZOLE (METROCREAM) 0.75 % cream Apply 1 application topically daily as needed (rosacea).    . minocycline (MINOCIN,DYNACIN) 100 MG capsule Take 100 mg by mouth daily as needed (rosacea).     Vladimir Faster Glycol-Propyl Glycol (SYSTANE OP) Apply 1 drop to eye daily as needed (dry eyes).    . Cholecalciferol (VITAMIN D3) 5000 units CAPS Take 5,000 Units by mouth daily.    Marland Kitchen doxylamine, Sleep, (SLEEP AID) 25 MG tablet Take 25 mg by mouth at bedtime as needed for sleep.     No current facility-administered medications for this visit.     OBJECTIVE: Middle-aged white woman who appears stated age  72:   02/02/17 1546  BP: (!) 155/88  Pulse: (!) 110  Resp: 18  Temp: 98.1 F (36.7 C)  SpO2: 100%     Body mass index is 28.92 kg/m.   Wt Readings from Last 3 Encounters:  02/02/17 168 lb 8 oz (76.4 kg)  01/27/17 165 lb 12.6 oz (75.2 kg)      ECOG FS:0 - Asymptomatic  Ocular: Sclerae unicteric, pupils round and equal Lymphatic: No cervical or supraclavicular adenopathy Lungs no rales or rhonchi Heart regular rate and rhythm Abd soft, nontender, positive bowel sounds MSK no focal spinal tenderness, no joint edema  Neuro: non-focal, well-oriented, appropriate affect Breasts: I do not palpate a mass in the right breast.  There are no skin or nipple changes of concern the right axilla is benign.  The left breast is benign.  The left axilla is benign.   LAB RESULTS:  CMP     Component Value Date/Time   NA 139 01/27/2017 1345   K 4.2 01/27/2017 1345   CL 104 01/27/2017 1345   CO2 22 01/27/2017 1345   GLUCOSE 100 (H) 01/27/2017 1345   BUN 12 01/27/2017 1345   CREATININE 0.83 01/27/2017 1345   CALCIUM 9.5 01/27/2017 1345   GFRNONAA >60 01/27/2017 1345   GFRAA >60 01/27/2017 1345    No results found for: TOTALPROTELP, ALBUMINELP, A1GS, A2GS, BETS, BETA2SER, GAMS, MSPIKE, SPEI  No results found  for: KPAFRELGTCHN, LAMBDASER, KAPLAMBRATIO  Lab Results  Component Value Date   WBC 5.7 01/27/2017   HGB 14.4 01/27/2017   HCT 43.7 01/27/2017   MCV 91.6 01/27/2017   PLT 237 01/27/2017    @LASTCHEMISTRY @  No results found for: LABCA2  No components found for: VELFYB017  No results for input(s): INR in the last 168 hours.  No results found for: LABCA2  No results found for: PZW258  No results found for: NID782  No results found for: UMP536  No results found for: CA2729  No components found for: HGQUANT  No results found for: CEA1 / No results found for: CEA1   No results found for: AFPTUMOR  No results found for: CHROMOGRNA  No results found for: PSA1  No visits with results within 3 Day(s) from this visit.  Latest known visit with results is:  Hospital Outpatient Visit on 01/27/2017  Component Date Value Ref Range Status  . WBC 01/27/2017 5.7  4.0 - 10.5 K/uL Final  . RBC 01/27/2017 4.77  3.87 - 5.11 MIL/uL Final  . Hemoglobin 01/27/2017 14.4  12.0 - 15.0 g/dL Final  . HCT 01/27/2017 43.7  36.0 - 46.0 % Final  . MCV 01/27/2017 91.6  78.0 - 100.0 fL Final  . MCH 01/27/2017 30.2  26.0 - 34.0 pg Final  . MCHC 01/27/2017 33.0  30.0 - 36.0 g/dL Final  . RDW 01/27/2017 12.0  11.5 - 15.5 % Final  . Platelets 01/27/2017 237  150 - 400 K/uL Final  . Sodium 01/27/2017 139  135 - 145 mmol/L Final  . Potassium 01/27/2017 4.2  3.5 - 5.1 mmol/L Final  . Chloride 01/27/2017 104  101 - 111 mmol/L Final  . CO2 01/27/2017 22  22 - 32 mmol/L Final  . Glucose, Bld 01/27/2017 100* 65 - 99 mg/dL Final  . BUN 01/27/2017 12  6 - 20 mg/dL Final  . Creatinine, Ser 01/27/2017 0.83  0.44 - 1.00 mg/dL Final  . Calcium 01/27/2017 9.5  8.9 - 10.3 mg/dL Final  . GFR calc non Af Amer 01/27/2017 >60  >60 mL/min Final  . GFR calc Af Amer 01/27/2017 >60  >60 mL/min Final   Comment: (NOTE) The eGFR has been calculated using the CKD EPI equation. This calculation has not been validated in  all clinical situations. eGFR's persistently <60 mL/min signify possible Chronic Kidney Disease.   . Anion gap 01/27/2017 13  5 - 15 Final    (this displays the last labs from the last 3 days)  No results found for: TOTALPROTELP, ALBUMINELP, A1GS, A2GS, BETS, BETA2SER, GAMS, MSPIKE, SPEI (this displays SPEP labs)  No results found for: KPAFRELGTCHN, LAMBDASER, KAPLAMBRATIO (kappa/lambda light chains)  No results found for: HGBA, HGBA2QUANT, HGBFQUANT, HGBSQUAN (Hemoglobinopathy evaluation)   No results found for: LDH  No results found for: IRON, TIBC, IRONPCTSAT (Iron and TIBC)  No results found for: FERRITIN  Urinalysis No results found for: COLORURINE, APPEARANCEUR, LABSPEC, PHURINE, GLUCOSEU, HGBUR, BILIRUBINUR, KETONESUR, PROTEINUR, UROBILINOGEN, NITRITE, LEUKOCYTESUR   STUDIES: Mr Breast Bilateral W [place] Cad  Result Date: 01/19/2017 CLINICAL DATA:  Recent diagnosis of right breast cancer at 2 sites, 1 o'clock axis (mass) and 10 o'clock axis (mass with distortion). History of benign left breast biopsy in 2007, fibroadenoma. LABS:  Creatinine 0.9, GFR 65 EXAM: BILATERAL BREAST MRI WITH AND WITHOUT CONTRAST TECHNIQUE: Multiplanar, multisequence MR images of both breasts were obtained prior to and following the intravenous administration of 15 ml of MultiHance. THREE-DIMENSIONAL MR IMAGE RENDERING ON INDEPENDENT WORKSTATION: Three-dimensional MR images were rendered by post-processing of the original MR data on an independent workstation. The three-dimensional MR images were interpreted, and findings are reported in the following complete MRI report for this study. Three dimensional images were evaluated at the independent DynaCad workstation COMPARISON:  Previous exams including diagnostic mammogram and ultrasound dated 12/19/2016, ultrasound guided biopsy dated 01/01/2017 and postprocedure mammogram dated 01/01/2017. FINDINGS: Breast composition: c. Heterogeneous  fibroglandular tissue. Background parenchymal enhancement: Moderate. Right breast: Biopsy-proven invasive mammary carcinoma with associated distortion in the upper-outer quadrant of the right breast, 10 o'clock axis by ultrasound, measuring 3.3 x 0.9 cm (AP by transverse dimensions), with associated biopsy clip artifact (series 6, images 78 through 86). Second biopsy-proven invasive carcinoma within the upper inner quadrant of the right breast, 1 o'clock axis by ultrasound, measuring 2 x 1.4 cm, with associated biopsy clip(series 6, images 58 through 64). Additional irregular enhancing mass within the slightly inner right breast, at middle depth, measuring 5 mm, with persistent enhancement kinetics, located approximately 1.5 cm medial to the biopsy-proven cancer at the 10 o'clock axis, suspected satellite lesion. Additional irregular enhancing mass within the upper inner quadrant of the right breast, at anterior depth, 2-3 o'clock axis, measuring 7 mm, with persistent enhancement kinetics (series 6, image 83). Left breast: No suspicious enhancing mass, non mass enhancement or secondary signs of malignancy are identified within the left breast. Lymph nodes: No enlarged or morphologically abnormal lymph nodes are seen within either axillary region or internal mammary chain regions. Ancillary findings:  None. IMPRESSION: 1. Biopsy-proven multicentric invasive carcinomas within the RIGHT breast at the 10 o'clock and 1 o'clock axes, measuring 3.3 cm and 2 cm respectively, with associated biopsy clip artifacts. 2. Additional suspicious enhancing mass within the slightly inner RIGHT breast, at middle depth, measuring 5 mm, located approximately 1.5 cm medial to the biopsy-proven cancer at the 10 o'clock axis, suspected satellite lesion. If breast conservation surgery is considered for the right breast, recommend MRI guided biopsy of this mass. 3. Additional suspicious enhancing mass within the upper inner quadrant of the  RIGHT breast, at anterior depth, 2-3 o'clock axis, measuring 7 mm. If breast conservation surgery is considered for the right breast, recommend MRI guided biopsy of this mass as well. 4. No evidence of malignancy within the LEFT breast. RECOMMENDATION: If breast conservation is considered for the RIGHT breast, recommend additional MRI guided biopsies of the 2 additional masses within the RIGHT breast, as detailed above, which are suspicious for additional multicentric disease. BI-RADS CATEGORY  4: Suspicious. Electronically Signed   By: Franki Cabot M.D.   On: 01/19/2017 08:57    ELIGIBLE FOR AVAILABLE RESEARCH PROTOCOL: no  ASSESSMENT: 72 y.o. [place], [place] Woman status post robot assisted abdominal hysterectomy bilateral salpingo-oophorectomy and bilateral pelvic lymph node dissection 04/13/2014 for a grade 1, stage Ib endometrial cancer; now  (1) s/p Right breast overlapping sites biopsy x2 (different quadrants) on 01/01/2017 for a clinical mT2 N0,  Stage IB invasive lobular carcinoma, grade 2,  estrogen and progesterone receptor positive, HER-2 not amplified  (a) breast MRI on 01/16/2017 shows 2 additional areas of concern in the right breast  (2) right mastectomy with sentinel lymph node sampling planned for 02/05/2017  (3) Oncotype DX to be obtained from the definitive surgical sample  (4) adjuvant radiation likely not indicated  (5) antiestrogens to follow at the completion of local treatment.    PLAN: We spent the better part of today's hour-long appointment discussing the biology of her diagnosis and the specifics of her situation. We first reviewed the fact that cancer is not one disease but more than 100 different diseases and that it is important to keep them separate-- otherwise when friends and relatives discuss their own cancer experiences with Doris Maynard confusion can result. Similarly we explained that if breast cancer spreads to the bone or liver, the patient would not have bone  cancer or liver cancer, but breast cancer in the bone and breast cancer in the liver: one cancer in three places-- not 3 different cancers which otherwise would have to be treated in 3 different ways.  We discussed the difference between local and systemic therapy. In terms of loco-regional treatment, lumpectomy plus radiation is equivalent to mastectomy as far as survival is concerned.  Ensues case however, she has 2 biopsy-proven lesions in different quadrants.  In addition there are 2 other lesions which would have to be biopsied and shown to be benign (which is unlikely) if she wanted breast conservation.  Finally, she understands she has a lobular breast cancer and it is very difficult in many cases even when there is only one lesion to obtain clear margins on this type of breast cancer.  For all those reasons I really do not see an alternative for her other than mastectomy and sentinel lymph node sampling and that is also her surgeon's recommendation.  She understands that postmastectomy radiation is rarely necessary but is not completely ruled out.  That decision will be made based on the final pathology findings postop  We then discussed the rationale for systemic therapy. There is some risk that this cancer may have already spread to other parts of her body. Patients frequently ask at this point about bone scans, CAT scans and PET scans to find out if they have occult breast cancer somewhere else. The problem is that in early stage disease we are much more likely to find false positives then true cancers and this would expose the patient to unnecessary procedures as well as unnecessary radiation. Scans cannot answer the question the patient really would like to know, which is whether she has microscopic disease elsewhere in her body. For those reasons we do not recommend them.  Of course we would proceed to aggressive evaluation of any symptoms that might suggest metastatic disease, but that is  not the case here.  Next we went over the options for systemic therapy which are anti-estrogens, anti-HER-2 immunotherapy, and chemotherapy. Doris Maynard does not meet criteria for anti-HER-2 immunotherapy. She is a good candidate for anti-estrogens.  The question of chemotherapy is more complicated. Chemotherapy is most effective in rapidly growing, aggressive tumors. It is much less effective  in low-grade, slow growing cancers, like Doris Maynard 's. For that reason we are going to request an Oncotype from the definitive surgical sample, as suggested by NCCN guidelines. That will help [place] make a definitive decision regarding chemotherapy in this case.  The plan then is to start with surgery, check an Oncotype, and then review those results.  Her surgery is already scheduled for 02/05/2017, and she will return to see me 02/27/2017 by which time we should have the Oncotype results in hand and should be able to make a definitive decision regarding adjuvant chemotherapy.  They also had questions regarding their 51 year old daughter, who now of course is at high risk of breast cancer.  She is already having mammography yearly, which is very appropriate.  She may also wish to have biannual breast exam by her physicians.  Finally if she wishes she can limit her tamoxifen or raloxifene for breast cancer risk reduction.  The patient understands that uterine cancer can be related to colon cancer and Lynch syndrome but the family history (despite the father having colon cancer at a very advanced age) does not meet testing criteria.  Doris Maynard has a good understanding of the overall plan. She agrees with it. She knows the goal of treatment in her case is cure. She will call with any problems that may develop before her next visit here.   Magrinat, Virgie Dad, MD  02/02/17 4:30 PM Medical Oncology and Hematology [facility] [address] [place], [place] [address] Tel. [phone]    Fax. [phone]  This document  serves as a record of services personally performed by Lurline Del, MD. It was created on his behalf by Sheron Nightingale, a trained medical scribe. The creation of this record is based on the scribe's personal observations and the provider's statements to them.   I have reviewed the above documentation for accuracy and completeness, and I agree with the above.

## 2017-02-03 ENCOUNTER — Telehealth: Payer: Self-pay | Admitting: Oncology

## 2017-02-03 NOTE — Telephone Encounter (Signed)
Scheduled appts per 1/28 los - left voicemail for patient regarding appts

## 2017-02-04 MED ORDER — CEFAZOLIN SODIUM-DEXTROSE 2-4 GM/100ML-% IV SOLN
2.0000 g | INTRAVENOUS | Status: AC
  Administered 2017-02-05: 2 g via INTRAVENOUS
  Filled 2017-02-04: qty 100

## 2017-02-05 ENCOUNTER — Ambulatory Visit (HOSPITAL_COMMUNITY): Payer: Medicare Other

## 2017-02-05 ENCOUNTER — Encounter (HOSPITAL_COMMUNITY)
Admission: RE | Admit: 2017-02-05 | Discharge: 2017-02-05 | Disposition: A | Discharge status: Home / Self Care | Payer: Medicare Other | Source: Ambulatory Visit | Attending: Surgery | Admitting: Surgery

## 2017-02-05 ENCOUNTER — Encounter (HOSPITAL_COMMUNITY): Payer: Self-pay

## 2017-02-05 ENCOUNTER — Other Ambulatory Visit: Payer: Self-pay

## 2017-02-05 ENCOUNTER — Observation Stay (HOSPITAL_COMMUNITY)
Admission: RE | Admit: 2017-02-05 | Discharge: 2017-02-06 | Disposition: A | Discharge status: Home / Self Care | Payer: Medicare Other | Source: Ambulatory Visit | Attending: Surgery | Admitting: Surgery

## 2017-02-05 ENCOUNTER — Encounter (HOSPITAL_COMMUNITY)
Admission: RE | Disposition: A | Discharge status: Home / Self Care | Payer: Self-pay | Source: Ambulatory Visit | Attending: Surgery

## 2017-02-05 DIAGNOSIS — Z881 Allergy status to other antibiotic agents status: Secondary | ICD-10-CM | POA: Diagnosis not present

## 2017-02-05 DIAGNOSIS — Z170 Estrogen receptor positive status [ER+]: Secondary | ICD-10-CM | POA: Diagnosis not present

## 2017-02-05 DIAGNOSIS — C50911 Malignant neoplasm of unspecified site of right female breast: Secondary | ICD-10-CM

## 2017-02-05 DIAGNOSIS — C50411 Malignant neoplasm of upper-outer quadrant of right female breast: Secondary | ICD-10-CM | POA: Diagnosis present

## 2017-02-05 DIAGNOSIS — Z79899 Other long term (current) drug therapy: Secondary | ICD-10-CM | POA: Diagnosis not present

## 2017-02-05 DIAGNOSIS — I10 Essential (primary) hypertension: Secondary | ICD-10-CM | POA: Insufficient documentation

## 2017-02-05 DIAGNOSIS — D0501 Lobular carcinoma in situ of right breast: Secondary | ICD-10-CM | POA: Diagnosis not present

## 2017-02-05 HISTORY — PX: MASTECTOMY COMPLETE / SIMPLE W/ SENTINEL NODE BIOPSY: SUR846

## 2017-02-05 HISTORY — PX: SIMPLE MASTECTOMY WITH AXILLARY SENTINEL NODE BIOPSY: SHX6098

## 2017-02-05 HISTORY — DX: Malignant neoplasm of endometrium: C54.1

## 2017-02-05 HISTORY — DX: Malignant neoplasm of unspecified site of right female breast: C50.911

## 2017-02-05 SURGERY — SIMPLE MASTECTOMY WITH AXILLARY SENTINEL NODE BIOPSY
Anesthesia: General | Site: Breast | Laterality: Right

## 2017-02-05 MED ORDER — GLYCOPYRROLATE 0.2 MG/ML IV SOSY
PREFILLED_SYRINGE | INTRAVENOUS | Status: DC | PRN
  Administered 2017-02-05: .2 mg via INTRAVENOUS

## 2017-02-05 MED ORDER — ONDANSETRON HCL 4 MG/2ML IJ SOLN: 4.0000 mg | Freq: Four times a day (QID) | INTRAMUSCULAR | Status: DC | PRN

## 2017-02-05 MED ORDER — DIPHENHYDRAMINE HCL 50 MG/ML IJ SOLN: 12.5000 mg | Freq: Four times a day (QID) | INTRAMUSCULAR | Status: DC | PRN

## 2017-02-05 MED ORDER — FENTANYL CITRATE (PF) 100 MCG/2ML IJ SOLN
100.0000 ug | Freq: Once | INTRAMUSCULAR | Status: AC
  Administered 2017-02-05: 50 ug via INTRAVENOUS
  Filled 2017-02-05: qty 2

## 2017-02-05 MED ORDER — OXYCODONE HCL 5 MG PO TABS: 5.0000 mg | ORAL_TABLET | ORAL | Status: DC | PRN

## 2017-02-05 MED ORDER — GABAPENTIN 300 MG PO CAPS
300.0000 mg | ORAL_CAPSULE | Freq: Two times a day (BID) | ORAL | Status: DC
  Administered 2017-02-05 – 2017-02-06 (×3): 300 mg via ORAL
  Filled 2017-02-05 (×3): qty 1

## 2017-02-05 MED ORDER — PHENYLEPHRINE HCL 10 MG/ML IJ SOLN
INTRAMUSCULAR | Status: DC | PRN
  Administered 2017-02-05: 50 ug/min via INTRAVENOUS

## 2017-02-05 MED ORDER — PHENYLEPHRINE 40 MCG/ML (10ML) SYRINGE FOR IV PUSH (FOR BLOOD PRESSURE SUPPORT)
PREFILLED_SYRINGE | INTRAVENOUS | Status: AC
  Filled 2017-02-05: qty 10

## 2017-02-05 MED ORDER — LISINOPRIL 20 MG PO TABS
20.0000 mg | ORAL_TABLET | Freq: Every day | ORAL | Status: DC
  Administered 2017-02-05 – 2017-02-06 (×2): 20 mg via ORAL
  Filled 2017-02-05 (×2): qty 1

## 2017-02-05 MED ORDER — FENTANYL CITRATE (PF) 250 MCG/5ML IJ SOLN
INTRAMUSCULAR | Status: AC
  Filled 2017-02-05: qty 5

## 2017-02-05 MED ORDER — EPHEDRINE SULFATE-NACL 50-0.9 MG/10ML-% IV SOSY
PREFILLED_SYRINGE | INTRAVENOUS | Status: DC | PRN
  Administered 2017-02-05: 10 mg via INTRAVENOUS

## 2017-02-05 MED ORDER — CEFAZOLIN SODIUM-DEXTROSE 2-4 GM/100ML-% IV SOLN
2.0000 g | Freq: Three times a day (TID) | INTRAVENOUS | Status: AC
  Administered 2017-02-05: 2 g via INTRAVENOUS
  Filled 2017-02-05: qty 100

## 2017-02-05 MED ORDER — SIMETHICONE 80 MG PO CHEW: 40.0000 mg | CHEWABLE_TABLET | Freq: Four times a day (QID) | ORAL | Status: DC | PRN

## 2017-02-05 MED ORDER — ONDANSETRON HCL 4 MG/2ML IJ SOLN
INTRAMUSCULAR | Status: DC | PRN
  Administered 2017-02-05: 4 mg via INTRAVENOUS

## 2017-02-05 MED ORDER — PHENYLEPHRINE 40 MCG/ML (10ML) SYRINGE FOR IV PUSH (FOR BLOOD PRESSURE SUPPORT)
PREFILLED_SYRINGE | INTRAVENOUS | Status: DC | PRN
  Administered 2017-02-05 (×3): 80 ug via INTRAVENOUS
  Administered 2017-02-05 (×2): 120 ug via INTRAVENOUS
  Administered 2017-02-05: 160 ug via INTRAVENOUS
  Administered 2017-02-05 (×2): 120 ug via INTRAVENOUS

## 2017-02-05 MED ORDER — LACTATED RINGERS IV SOLN
INTRAVENOUS | Status: DC | PRN
  Administered 2017-02-05 (×2): via INTRAVENOUS

## 2017-02-05 MED ORDER — GABAPENTIN 300 MG PO CAPS
300.0000 mg | ORAL_CAPSULE | ORAL | Status: AC
  Administered 2017-02-05: 300 mg via ORAL
  Filled 2017-02-05: qty 1

## 2017-02-05 MED ORDER — PROPOFOL 10 MG/ML IV BOLUS
INTRAVENOUS | Status: AC
  Filled 2017-02-05: qty 20

## 2017-02-05 MED ORDER — ROCURONIUM BROMIDE 10 MG/ML (PF) SYRINGE
PREFILLED_SYRINGE | INTRAVENOUS | Status: AC
  Filled 2017-02-05: qty 5

## 2017-02-05 MED ORDER — ONDANSETRON 4 MG PO TBDP: 4.0000 mg | ORAL_TABLET | Freq: Four times a day (QID) | ORAL | Status: DC | PRN

## 2017-02-05 MED ORDER — LIDOCAINE 2% (20 MG/ML) 5 ML SYRINGE
INTRAMUSCULAR | Status: DC | PRN
  Administered 2017-02-05: 80 mg via INTRAVENOUS

## 2017-02-05 MED ORDER — DIPHENHYDRAMINE HCL 12.5 MG/5ML PO ELIX: 12.5000 mg | ORAL_SOLUTION | Freq: Four times a day (QID) | ORAL | Status: DC | PRN

## 2017-02-05 MED ORDER — TECHNETIUM TC 99M SULFUR COLLOID FILTERED
1.0000 | Freq: Once | INTRAVENOUS | Status: AC | PRN
  Administered 2017-02-05: 1 via INTRADERMAL

## 2017-02-05 MED ORDER — METHOCARBAMOL 500 MG PO TABS: 500.0000 mg | ORAL_TABLET | Freq: Four times a day (QID) | ORAL | Status: DC | PRN

## 2017-02-05 MED ORDER — ENOXAPARIN SODIUM 40 MG/0.4ML ~~LOC~~ SOLN
40.0000 mg | SUBCUTANEOUS | Status: DC
  Administered 2017-02-06: 40 mg via SUBCUTANEOUS
  Filled 2017-02-05: qty 0.4

## 2017-02-05 MED ORDER — DEXAMETHASONE SODIUM PHOSPHATE 10 MG/ML IJ SOLN
INTRAMUSCULAR | Status: DC | PRN
  Administered 2017-02-05: 5 mg via INTRAVENOUS

## 2017-02-05 MED ORDER — CELECOXIB 200 MG PO CAPS
200.0000 mg | ORAL_CAPSULE | Freq: Two times a day (BID) | ORAL | Status: DC
  Administered 2017-02-05 – 2017-02-06 (×3): 200 mg via ORAL
  Filled 2017-02-05 (×3): qty 1

## 2017-02-05 MED ORDER — 0.9 % SODIUM CHLORIDE (POUR BTL) OPTIME
TOPICAL | Status: DC | PRN
  Administered 2017-02-05: 1000 mL

## 2017-02-05 MED ORDER — HYDRALAZINE HCL 20 MG/ML IJ SOLN: 10.0000 mg | INTRAMUSCULAR | Status: DC | PRN

## 2017-02-05 MED ORDER — ONDANSETRON HCL 4 MG/2ML IJ SOLN
INTRAMUSCULAR | Status: AC
  Filled 2017-02-05: qty 2

## 2017-02-05 MED ORDER — EPHEDRINE 5 MG/ML INJ
INTRAVENOUS | Status: AC
  Filled 2017-02-05: qty 10

## 2017-02-05 MED ORDER — PROPOFOL 10 MG/ML IV BOLUS
INTRAVENOUS | Status: DC | PRN
  Administered 2017-02-05: 140 mg via INTRAVENOUS

## 2017-02-05 MED ORDER — FENTANYL CITRATE (PF) 100 MCG/2ML IJ SOLN
INTRAMUSCULAR | Status: DC | PRN
  Administered 2017-02-05 (×3): 50 ug via INTRAVENOUS
  Administered 2017-02-05 (×2): 25 ug via INTRAVENOUS
  Administered 2017-02-05: 50 ug via INTRAVENOUS

## 2017-02-05 MED ORDER — DEXAMETHASONE SODIUM PHOSPHATE 10 MG/ML IJ SOLN
INTRAMUSCULAR | Status: AC
  Filled 2017-02-05: qty 1

## 2017-02-05 MED ORDER — ACETAMINOPHEN 500 MG PO TABS
1000.0000 mg | ORAL_TABLET | ORAL | Status: AC
  Administered 2017-02-05: 1000 mg via ORAL
  Filled 2017-02-05: qty 2

## 2017-02-05 MED ORDER — MIDAZOLAM HCL 2 MG/2ML IJ SOLN
2.0000 mg | Freq: Once | INTRAMUSCULAR | Status: AC
  Administered 2017-02-05: 2 mg via INTRAVENOUS
  Filled 2017-02-05: qty 2

## 2017-02-05 MED ORDER — HYDROMORPHONE HCL 1 MG/ML IJ SOLN: 1.0000 mg | INTRAMUSCULAR | Status: DC | PRN

## 2017-02-05 MED ORDER — DEXTROSE-NACL 5-0.9 % IV SOLN
INTRAVENOUS | Status: DC
  Administered 2017-02-05 – 2017-02-06 (×2): via INTRAVENOUS

## 2017-02-05 MED ORDER — METHYLENE BLUE 0.5 % INJ SOLN
INTRAVENOUS | Status: AC
  Filled 2017-02-05: qty 10

## 2017-02-05 MED ORDER — ZOLPIDEM TARTRATE 5 MG PO TABS: 5.0000 mg | ORAL_TABLET | Freq: Every evening | ORAL | Status: DC | PRN

## 2017-02-05 MED ORDER — MIDAZOLAM HCL 2 MG/2ML IJ SOLN
INTRAMUSCULAR | Status: AC
  Filled 2017-02-05: qty 2

## 2017-02-05 MED ORDER — ACETAMINOPHEN 500 MG PO TABS
1000.0000 mg | ORAL_TABLET | Freq: Four times a day (QID) | ORAL | Status: DC
  Administered 2017-02-05 – 2017-02-06 (×3): 1000 mg via ORAL
  Filled 2017-02-05 (×3): qty 2

## 2017-02-05 MED ORDER — MIDAZOLAM HCL 2 MG/2ML IJ SOLN
INTRAMUSCULAR | Status: DC | PRN
  Administered 2017-02-05: 2 mg via INTRAVENOUS

## 2017-02-05 SURGICAL SUPPLY — 52 items
APPLIER CLIP 9.375 MED OPEN (MISCELLANEOUS) ×3
BINDER BREAST LRG (GAUZE/BANDAGES/DRESSINGS) IMPLANT
BINDER BREAST XLRG (GAUZE/BANDAGES/DRESSINGS) ×3 IMPLANT
BIOPATCH RED 1 DISK 7.0 (GAUZE/BANDAGES/DRESSINGS) ×4 IMPLANT
BIOPATCH RED 1IN DISK 7.0MM (GAUZE/BANDAGES/DRESSINGS) ×2
CANISTER SUCT 3000ML PPV (MISCELLANEOUS) ×3 IMPLANT
CHLORAPREP W/TINT 26ML (MISCELLANEOUS) ×3 IMPLANT
CLIP APPLIE 9.375 MED OPEN (MISCELLANEOUS) ×1 IMPLANT
CONT SPEC 4OZ CLIKSEAL STRL BL (MISCELLANEOUS) ×3 IMPLANT
COVER PROBE W GEL 5X96 (DRAPES) ×3 IMPLANT
COVER SURGICAL LIGHT HANDLE (MISCELLANEOUS) ×3 IMPLANT
DERMABOND ADVANCED (GAUZE/BANDAGES/DRESSINGS) ×4
DERMABOND ADVANCED .7 DNX12 (GAUZE/BANDAGES/DRESSINGS) ×2 IMPLANT
DRAIN CHANNEL 19F RND (DRAIN) ×6 IMPLANT
DRAPE CHEST BREAST 15X10 FENES (DRAPES) ×3 IMPLANT
DRSG PAD ABDOMINAL 8X10 ST (GAUZE/BANDAGES/DRESSINGS) ×6 IMPLANT
DRSG TEGADERM 4X4.75 (GAUZE/BANDAGES/DRESSINGS) ×6 IMPLANT
ELECT CAUTERY BLADE 6.4 (BLADE) ×3 IMPLANT
ELECT REM PT RETURN 9FT ADLT (ELECTROSURGICAL) ×3
ELECTRODE REM PT RTRN 9FT ADLT (ELECTROSURGICAL) ×1 IMPLANT
EVACUATOR SILICONE 100CC (DRAIN) ×6 IMPLANT
GLOVE BIO SURGEON STRL SZ 6.5 (GLOVE) ×2 IMPLANT
GLOVE BIO SURGEON STRL SZ7 (GLOVE) ×3 IMPLANT
GLOVE BIO SURGEON STRL SZ8 (GLOVE) ×6 IMPLANT
GLOVE BIO SURGEONS STRL SZ 6.5 (GLOVE) ×1
GLOVE BIOGEL PI IND STRL 7.0 (GLOVE) ×1 IMPLANT
GLOVE BIOGEL PI IND STRL 7.5 (GLOVE) ×1 IMPLANT
GLOVE BIOGEL PI IND STRL 8 (GLOVE) ×2 IMPLANT
GLOVE BIOGEL PI INDICATOR 7.0 (GLOVE) ×2
GLOVE BIOGEL PI INDICATOR 7.5 (GLOVE) ×2
GLOVE BIOGEL PI INDICATOR 8 (GLOVE) ×4
GLOVE ECLIPSE 7.0 STRL STRAW (GLOVE) ×3 IMPLANT
GOWN STRL REUS W/ TWL LRG LVL3 (GOWN DISPOSABLE) ×3 IMPLANT
GOWN STRL REUS W/ TWL XL LVL3 (GOWN DISPOSABLE) ×1 IMPLANT
GOWN STRL REUS W/TWL LRG LVL3 (GOWN DISPOSABLE) ×6
GOWN STRL REUS W/TWL XL LVL3 (GOWN DISPOSABLE) ×2
KIT BASIN OR (CUSTOM PROCEDURE TRAY) ×3 IMPLANT
KIT ROOM TURNOVER OR (KITS) ×3 IMPLANT
NEEDLE 18GX1X1/2 (RX/OR ONLY) (NEEDLE) IMPLANT
NEEDLE FILTER BLUNT 18X 1/2SAF (NEEDLE)
NEEDLE FILTER BLUNT 18X1 1/2 (NEEDLE) IMPLANT
NEEDLE HYPO 25GX1X1/2 BEV (NEEDLE) IMPLANT
NS IRRIG 1000ML POUR BTL (IV SOLUTION) ×3 IMPLANT
PACK GENERAL/GYN (CUSTOM PROCEDURE TRAY) ×3 IMPLANT
PAD ARMBOARD 7.5X6 YLW CONV (MISCELLANEOUS) ×3 IMPLANT
SPECIMEN JAR X LARGE (MISCELLANEOUS) ×3 IMPLANT
SUT ETHILON 3 0 FSL (SUTURE) ×6 IMPLANT
SUT MNCRL AB 4-0 PS2 18 (SUTURE) ×3 IMPLANT
SUT VIC AB 3-0 SH 18 (SUTURE) ×3 IMPLANT
SYR CONTROL 10ML LL (SYRINGE) IMPLANT
TOWEL OR 17X24 6PK STRL BLUE (TOWEL DISPOSABLE) ×3 IMPLANT
TOWEL OR 17X26 10 PK STRL BLUE (TOWEL DISPOSABLE) ×3 IMPLANT

## 2017-02-05 NOTE — Anesthesia Preprocedure Evaluation (Addendum)
Anesthesia Evaluation  Patient identified by MRN, date of birth, ID band Patient awake    Reviewed: Allergy & Precautions, H&P , Patient's Chart, lab work & pertinent test results, reviewed documented beta blocker date and time   Airway Mallampati: II  TM Distance: >3 FB Neck ROM: full    Dental no notable dental hx.    Pulmonary    Pulmonary exam normal breath sounds clear to auscultation       Cardiovascular hypertension,  Rhythm:regular Rate:Normal     Neuro/Psych    GI/Hepatic   Endo/Other    Renal/GU      Musculoskeletal   Abdominal   Peds  Hematology   Anesthesia Other Findings   Reproductive/Obstetrics                             Anesthesia Physical Anesthesia Plan  ASA: II  Anesthesia Plan: General   Post-op Pain Management:  Regional for Post-op pain   Induction: Intravenous  PONV Risk Score and Plan: 2 and Treatment may vary due to age or medical condition, Dexamethasone and Ondansetron  Airway Management Planned: LMA  Additional Equipment:   Intra-op Plan:   Post-operative Plan: Extubation in OR  Informed Consent: I have reviewed the patients History and Physical, chart, labs and discussed the procedure including the risks, benefits and alternatives for the proposed anesthesia with the patient or authorized representative who has indicated his/her understanding and acceptance.   Dental Advisory Given  Plan Discussed with: CRNA and Surgeon  Anesthesia Plan Comments: (  )       Anesthesia Quick Evaluation

## 2017-02-05 NOTE — Anesthesia Procedure Notes (Signed)
Anesthesia Regional Block: Pectoralis block   Pre-Anesthetic Checklist: ,,,, Correct Site,, Correct Procedure, Correct Position, site marked,, risks and benefits discussed,, pre-op evaluation, at surgeon's request and post-op pain management Needles:   Needle Type: Echogenic Needle     Needle Length: 9cm  Needle Gauge: 21     Additional Needles:   Procedures:,,,, ultrasound used (permanent image in chart),,,,  Narrative:  Start time: 02/05/2017 10:22 AM End time: 02/05/2017 10:28 AM Injection made incrementally with aspirations every 5 mL. Anesthesiologist: Lyndle Herrlich, MD

## 2017-02-05 NOTE — Interval H&P Note (Signed)
History and Physical Interval Note:  02/05/2017 11:32 AM  Doris Maynard  has presented today for surgery, with the diagnosis of RIGHT BREAST CANCER  The various methods of treatment have been discussed with the patient and family. After consideration of risks, benefits and other options for treatment, the patient has consented to  Procedure(s): RIGHT SIMPLE MASTECTOMY WITH AXILLARY SENTINEL NODE BIOPSY (Right) as a surgical intervention .  The patient's history has been reviewed, patient examined, no change in status, stable for surgery.  I have reviewed the patient's chart and labs.  Questions were answered to the patient's satisfaction.     [place]

## 2017-02-05 NOTE — Anesthesia Procedure Notes (Addendum)
Procedure Name: LMA Insertion Date/Time: 02/05/2017 11:45 AM Performed by: Harden Mo, CRNA Pre-anesthesia Checklist: Patient identified, Emergency Drugs available, Suction available, Patient being monitored and Timeout performed Patient Re-evaluated:Patient Re-evaluated prior to induction Oxygen Delivery Method: Circle system utilized Preoxygenation: Pre-oxygenation with 100% oxygen Induction Type: IV induction Ventilation: Mask ventilation without difficulty LMA: LMA inserted LMA Size: 4.0 Placement Confirmation: positive ETCO2 and breath sounds checked- equal and bilateral Tube secured with: Tape Dental Injury: Teeth and Oropharynx as per pre-operative assessment

## 2017-02-05 NOTE — Transfer of Care (Signed)
Immediate Anesthesia Transfer of Care Note  Patient: Doris Maynard  Procedure(s) Performed: RIGHT SIMPLE MASTECTOMY WITH AXILLARY SENTINEL NODE BIOPSY (Right Breast)  Patient Location: PACU  Anesthesia Type:General and Regional  Level of Consciousness: awake, alert  and oriented  Airway & Oxygen Therapy: Patient Spontanous Breathing  Post-op Assessment: Report given to RN, Post -op Vital signs reviewed and stable and Patient moving all extremities X 4  Post vital signs: Reviewed and stable  Last Vitals:  Vitals:   02/05/17 0956  BP: (!) 174/85  Pulse: 99  Resp: 18  Temp: 36.8 C  SpO2: 99%    Last Pain:  Vitals:   02/05/17 0956  TempSrc: Oral      Patients Stated Pain Goal: 4 (82/50/03 7048)  Complications: No apparent anesthesia complications

## 2017-02-05 NOTE — Op Note (Signed)
Doris Maynard Nov 21, 1945 [id number] 02/05/2017   Preoperative diagnosis: right breast cancer multifocal  Postoperative diagnosis: same  Procedure: right simple mastectomy and right sentinel lymph node mapping   Surgeon: Burke  Assistant surgeon: none  Anesthesia:GA combined with regional for post-op pain  Clinical History and Indications:The patient is seen in the holding area and we reviewed the plans for the procedure as noted above. We reviewed the risks and complications a final time. She had no further questions. I marked theright side as the operative side. The surgical and non surgical options have been discussed with the patient.  Risks of surgery include bleeding,  Infection,  Flap necrosis,  Tissue loss,  Chronic pain, death, Numbness,  And the need for additional procedures.  Reconstruction options also have been discussed with the patient as well.  The patient agrees to proceed.  Description of Procedure: The patient was taken to the operating room. After satisfactory general anesthesia was obtained the timeout was done.   IA full prep and drape was then done.  I outlined an elliptical incision and marked the inframammary fold and midline. The incision was made. The usual skin flaps were raised. I went to the clavicle superiorly and sternum medially and towards the latissimus laterally.    After the superior flap was complete I was able to use the neoprobe to locate a sentinel node.  There were 4 hot sentinel nodes in the  deep axilla level 1 and level 2   Nodes identified and removed.  Background counts approached zero.   Hemostasis achieved.  Th long thoracic nerve, thoracodorsal trunk and axillary vein were preserved.   .   The inferior flap was then made going to the inframammary fold and out to the latissimus. The breast was then removed from the pectoralis starting medially and working laterally. When I got to the lateral aspect of the pectoralis major  muscle I opened the clavipectoral fascia. I finished removing the breast from the serratus muscles.    I therefore completed the mastectomy by dividing the remaining attachments from the breast to the chest wall and latissimus but not taking any more tissue from the axilla. The specimen was marked to orient it for the pathologist and passed off the table.  I spent several minutes irrigating and making sure everything was dry. I then placed two  19 French Blake drains through a small incisions in the inferior flap and laid along the pectoralis muscle. Both  secured with a 2-0 nylon suture.  Another irrigation and check for hemostasis was made. Everything appeared to be dry. Incision was then closed with 3 O vicryl and 4 0 monocryl.   The patient tolerated the procedure well. There were no operative complications. All counts were correct. Estimated blood loss was 60 cc. Sterile dressings were applied and the patient taken to the PACU in satisfactory condition.  Turner Daniels, MD, FACS 02/05/2017 1:18 PM

## 2017-02-05 NOTE — Anesthesia Postprocedure Evaluation (Signed)
Anesthesia Post Note  Patient: Doris Maynard  Procedure(s) Performed: RIGHT SIMPLE MASTECTOMY WITH AXILLARY SENTINEL NODE BIOPSY (Right Breast)     Patient location during evaluation: PACU Anesthesia Type: General Level of consciousness: awake and alert Pain management: pain level controlled Vital Signs Assessment: post-procedure vital signs reviewed and stable Respiratory status: spontaneous breathing, nonlabored ventilation, respiratory function stable and patient connected to nasal cannula oxygen Cardiovascular status: blood pressure returned to baseline and stable Postop Assessment: no apparent nausea or vomiting Anesthetic complications: no    Last Vitals:  Vitals:   02/05/17 1355 02/05/17 1427  BP: (!) 121/52 127/73  Pulse: (!) 106 (!) 107  Resp: 12 13  Temp:  (!) 36.4 C  SpO2: 100% 95%    Last Pain:  Vitals:   02/05/17 1430  TempSrc:   PainSc: 3                  Azrael Maddix EDWARD

## 2017-02-05 NOTE — Discharge Instructions (Signed)
[facility] °[phone] ° °MASTECTOMY: POST OP INSTRUCTIONS ° °Always review your discharge instruction sheet given to you by the facility where your surgery was performed. °IF YOU HAVE DISABILITY OR FAMILY LEAVE FORMS, YOU MUST BRING THEM TO THE OFFICE FOR PROCESSING.   °DO NOT GIVE THEM TO YOUR DOCTOR. °A prescription for pain medication may be given to you upon discharge.  Take your pain medication as prescribed, if needed.  If narcotic pain medicine is not needed, then you may take acetaminophen (Tylenol) or ibuprofen (Advil) as needed. °1. Take your usually prescribed medications unless otherwise directed. °2. If you need a refill on your pain medication, please contact your pharmacy.  They will contact our office to request authorization.  Prescriptions will not be filled after 5pm or on week-ends. °3. You should follow a light diet the first few days after arrival home, such as soup and crackers, etc.  Resume your normal diet the day after surgery. °4. Most patients will experience some swelling and bruising on the chest and underarm.  Ice packs will help.  Swelling and bruising can take several days to resolve.  °5. It is common to experience some constipation if taking pain medication after surgery.  Increasing fluid intake and taking a stool softener (such as Colace) will usually help or prevent this problem from occurring.  A mild laxative (Milk of Magnesia or Miralax) should be taken according to package instructions if there are no bowel movements after 48 hours. °6. Unless discharge instructions indicate otherwise, leave your bandage dry and in place until your next appointment in 3-5 days.  You may take a limited sponge bath.  No tube baths or showers until the drains are removed.  You may have steri-strips (small skin tapes) in place directly over the incision.  These strips should be left on the skin for 7-10 days.  If your surgeon used skin glue on the incision, you may  shower in 24 hours.  The glue will flake off over the next 2-3 weeks.  Any sutures or staples will be removed at the office during your follow-up visit. °7. DRAINS:  If you have drains in place, it is important to keep a list of the amount of drainage produced each day in your drains.  Before leaving the hospital, you should be instructed on drain care.  Call our office if you have any questions about your drains. °8. ACTIVITIES:  You may resume regular (light) daily activities beginning the next day--such as daily self-care, walking, climbing stairs--gradually increasing activities as tolerated.  You may have sexual intercourse when it is comfortable.  Refrain from any heavy lifting or straining until approved by your doctor. °a. You may drive when you are no longer taking prescription pain medication, you can comfortably wear a seatbelt, and you can safely maneuver your car and apply brakes. °b. RETURN TO WORK:  __________________________________________________________ °9. You should see your doctor in the office for a follow-up appointment approximately 3-5 days after your surgery.  Your doctor’s nurse will typically make your follow-up appointment when she calls you with your pathology report.  Expect your pathology report 2-3 business days after your surgery.  You may call to check if you do not hear from us after three days.   °10. OTHER INSTRUCTIONS: ______________________________________________________________________________________________ ____________________________________________________________________________________________ °WHEN TO CALL YOUR DOCTOR: °1. Fever over 101.0 °2. Nausea and/or vomiting °3. Extreme swelling or bruising °4. Continued bleeding from incision. °5. Increased pain, redness, or drainage from the incision. °  The clinic staff is available to answer your questions during regular business hours.  Please dont hesitate to call and ask to speak to one of the nurses for clinical  concerns.  If you have a medical emergency, go to the nearest emergency room or call 911.  A surgeon from [facility] is always on call at the hospital. [address] [place], [place], [place]  [address] ? [address], [place], [place]   [address] [phone] ? [phone] ? FAX [phone] Web site: [place] Surgical drains are used to remove extra fluid that normally builds up in a surgical wound after surgery. A surgical drain helps to heal a surgical wound. Different kinds of surgical drains include:  Active drains. These drains use suction to pull drainage away from the surgical wound. Drainage flows through a tube to a container outside of the body. It is important to keep the bulb or the drainage container flat (compressed) at all times, except while you empty it. Flattening the bulb or container creates suction. The two most common types of active drains are bulb drains and Hemovac drains.  Passive drains. These drains allow fluid to drain naturally, by gravity. Drainage flows through a tube to a bandage (dressing) or a container outside of the body. Passive drains do not need to be emptied. The most common type of passive drain is the Penrose drain.  A drain is placed during surgery. Immediately after surgery, drainage is usually bright red and a little thicker than water. The drainage may gradually turn yellow or pink and become thinner. It is likely that your health care provider will remove the drain when the drainage stops or when the amount decreases to 1-2 Tbsp (15-30 mL) during a 24-hour period. How to care for your surgical drain  Keep the skin around the drain dry and covered with a dressing at all times.  Check your drain area every day for signs of infection. Check for: ? More redness, swelling, or pain. ? Pus or a bad smell. ? Cloudy drainage. Follow instructions from your health care provider about how to take care of your  drain and how to change your dressing. Change your dressing at least one time every day. Change it more often if needed to keep the dressing dry. Make sure you: 1. Gather your supplies, including: ? Tape. ? Germ-free cleaning solution (sterile saline). ? Split gauze drain sponge: 4 x 4 inches (10 x 10 cm). ? Gauze square: 4 x 4 inches (10 x 10 cm). 2. Wash your hands with soap and water before you change your dressing. If soap and water are not available, use hand sanitizer. 3. Remove the old dressing. Avoid using scissors to do that. 4. Use sterile saline to clean your skin around the drain. 5. Place the tube through the slit in a drain sponge. Place the drain sponge so that it covers your wound. 6. Place the gauze square or another drain sponge on top of the drain sponge that is on the wound. Make sure the tube is between those layers. 7. Tape the dressing to your skin. 8. If you have an active bulb or Hemovac drain, tape the drainage tube to your skin 1-2 inches (2.5-5 cm) below the place where the tube enters your body. Taping keeps the tube from pulling on any stitches (sutures) that you have. 9. Wash your hands with soap and water. 10. Write down the color of your drainage  and how often you change your dressing. ° °How to empty your active bulb or Hemovac drain °1. Make sure that you have a measuring cup that you can empty your drainage into. °2. Wash your hands with soap and water. If soap and water are not available, use hand sanitizer. °3. Gently move your fingers down the tube while squeezing very lightly. This is called stripping the tube. This clears any drainage, clots, or tissue from the tube. °? Do not pull on the tube. °? You may need to strip the tube several times every day to keep the tube clear. °4. Open the bulb cap or the drain plug. Do not touch the inside of the cap or the bottom of the plug. °5. Empty all of the drainage into the measuring cup. °6. Compress the bulb or the  container and replace the cap or the plug. To compress the bulb or the container, squeeze it firmly in the middle while you close the cap or plug the container. °7. Write down the amount of drainage that you have in each 24-hour period. If you have less than 2 Tbsp (30 mL) of drainage during 24 hours, contact your health care provider. °8. Flush the drainage down the toilet. °9. Wash your hands with soap and water. °Contact a health care provider if: °· You have more redness, swelling, or pain around your drain area. °· The amount of drainage that you have is increasing instead of decreasing. °· You have pus or a bad smell coming from your drain area. °· You have a fever. °· You have drainage that is cloudy. °· There is a sudden stop or a sudden decrease in the amount of drainage that you have. °· Your tube falls out. °· Your active drain does not stay compressed after you empty it. °This information is not intended to replace advice given to you by your health care provider. Make sure you discuss any questions you have with your health care provider. °Document Released: 12/21/1999 Document Revised: 05/31/2015 Document Reviewed: 07/12/2014 °Elsevier Interactive Patient Education © 2018 [employer] Inc. ° °

## 2017-02-06 ENCOUNTER — Encounter (HOSPITAL_COMMUNITY): Payer: Self-pay | Admitting: Surgery

## 2017-02-06 DIAGNOSIS — D0501 Lobular carcinoma in situ of right breast: Secondary | ICD-10-CM | POA: Diagnosis not present

## 2017-02-06 LAB — CBC
HCT: 38.1 % (ref 36.0–46.0)
Hemoglobin: 12.6 g/dL (ref 12.0–15.0)
MCH: 30.4 pg (ref 26.0–34.0)
MCHC: 33.1 g/dL (ref 30.0–36.0)
MCV: 91.8 fL (ref 78.0–100.0)
PLATELETS: 227 10*3/uL (ref 150–400)
RBC: 4.15 MIL/uL (ref 3.87–5.11)
RDW: 12.4 % (ref 11.5–15.5)
WBC: 9.5 10*3/uL (ref 4.0–10.5)

## 2017-02-06 LAB — COMPREHENSIVE METABOLIC PANEL
ALBUMIN: 3.5 g/dL (ref 3.5–5.0)
ALT: 17 U/L (ref 14–54)
ANION GAP: 10 (ref 5–15)
AST: 20 U/L (ref 15–41)
Alkaline Phosphatase: 73 U/L (ref 38–126)
BILIRUBIN TOTAL: 0.9 mg/dL (ref 0.3–1.2)
BUN: 7 mg/dL (ref 6–20)
CALCIUM: 9.1 mg/dL (ref 8.9–10.3)
CO2: 21 mmol/L — ABNORMAL LOW (ref 22–32)
CREATININE: 0.79 mg/dL (ref 0.44–1.00)
Chloride: 107 mmol/L (ref 101–111)
GFR calc Af Amer: >60 mL/min (ref >60)
Glucose, Bld: 140 mg/dL — ABNORMAL HIGH (ref 65–99)
POTASSIUM: 4 mmol/L (ref 3.5–5.1)
Sodium: 138 mmol/L (ref 135–145)
Total Protein: 6 g/dL — ABNORMAL LOW (ref 6.5–8.1)

## 2017-02-06 MED ORDER — ROPIVACAINE HCL 7.5 MG/ML IJ SOLN
INTRAMUSCULAR | Status: DC | PRN
  Administered 2017-02-05: 20 mL via PERINEURAL

## 2017-02-06 MED ORDER — BUPIVACAINE-EPINEPHRINE (PF) 0.25% -1:200000 IJ SOLN
INTRAMUSCULAR | Status: DC | PRN
  Administered 2017-02-05: 5 mL

## 2017-02-06 MED ORDER — OXYCODONE HCL 5 MG PO TABS: 5.0000 mg | ORAL_TABLET | Freq: Four times a day (QID) | ORAL | 0 refills | Status: DC | PRN

## 2017-02-06 MED ORDER — METHOCARBAMOL 500 MG PO TABS: 500.0000 mg | ORAL_TABLET | Freq: Four times a day (QID) | ORAL | 0 refills | Status: DC | PRN

## 2017-02-06 NOTE — Discharge Summary (Signed)
Physician Discharge Summary  Patient ID: Doris Maynard MRN: [id number] DOB/AGE: 1945/06/21 72 y.o.  Admit date: 02/05/2017 Discharge date: 02/06/2017  Admission Diagnoses:right breast cancer  Discharge Diagnoses:  Active Problems:   Breast cancer, stage 1, estrogen receptor positive, right [facility])   Discharged Condition: good  Hospital Course: Unremarkable.  She did well. She ambulated,  tolerated her diet and had good pain control.   Consults: None    Treatments: surgery: right mastectomy and SLN mapping   Discharge Exam: Blood pressure (!) 120/59, pulse 100, temperature (!) 97.5 F (36.4 C), temperature source Oral, resp. rate 14, height 5\' 4"  (1.626 m), weight 76.4 kg (168 lb 8 oz), SpO2 100 %. Incision/Wound:right chest flaps viable no hematoma  Drains serous in nature   Disposition: Final discharge disposition not confirmed  Discharge Instructions    Diet - low sodium heart healthy   Complete by:  As directed    Increase activity slowly   Complete by:  As directed      Allergies as of 02/06/2017      Reactions   Clindamycin Diarrhea   ? C.DIFF. ?      Medication List    TAKE these medications   lisinopril 20 MG tablet Commonly known as:  PRINIVIL,ZESTRIL Take 20 mg by mouth daily.   methocarbamol 500 MG tablet Commonly known as:  ROBAXIN Take 1 tablet (500 mg total) by mouth every 6 (six) hours as needed for muscle spasms.   metroNIDAZOLE 0.75 % cream Commonly known as:  METROCREAM Apply 1 application topically daily as needed (rosacea).   minocycline 100 MG capsule Commonly known as:  MINOCIN,DYNACIN Take 100 mg by mouth daily as needed (rosacea).   oxyCODONE 5 MG immediate release tablet Commonly known as:  Oxy IR/ROXICODONE Take 1 tablet (5 mg total) by mouth every 6 (six) hours as needed for moderate pain.   SLEEP AID 25 MG tablet Generic drug:  doxylamine (Sleep) Take 25 mg by mouth at bedtime as needed for sleep.   SYSTANE OP Apply 1 drop  to eye daily as needed (dry eyes).   Vitamin D3 5000 units Caps Take 5,000 Units by mouth daily.        Signed: Joyice Faster Murphy Duzan 02/06/2017, 8:01 AM

## 2017-02-06 NOTE — Progress Notes (Signed)
Discharge home. Home discharge instruction given, Jp teaching given to patient and husband. No questions verbalized.

## 2017-02-06 NOTE — Addendum Note (Signed)
Addendum  created 02/06/17 0720 by Lyndle Herrlich, MD   Intraprocedure Meds edited

## 2017-02-06 NOTE — Progress Notes (Signed)
1 Day Post-Op   Subjective/Chief Complaint: Pt doing well   Objective: Vital signs in last 24 hours: Temp:  [97.3 F (36.3 C)-98.2 F (36.8 C)] 97.5 F (36.4 C) (02/01 0556) Pulse Rate:  [94-107] 100 (02/01 0556) Resp:  [12-18] 14 (02/01 0556) BP: (107-174)/(52-85) 120/59 (02/01 0556) SpO2:  [95 %-100 %] 100 % (02/01 0556) Weight:  [76.4 kg (168 lb 8 oz)] 76.4 kg (168 lb 8 oz) (01/31 0956) Last BM Date: 02/05/17  Intake/Output from previous day: 01/31 0701 - 02/01 0700 In: 1700 [P.O.:700; I.V.:1000] Out: 205 [Drains:105; Blood:100] Intake/Output this shift: No intake/output data recorded.  Incision/Wound:CDI flaps viable no hematoma   Lab Results:  Recent Labs    02/06/17 0517  WBC 9.5  HGB 12.6  HCT 38.1  PLT 227   BMET Recent Labs    02/06/17 0517  NA 138  K 4.0  CL 107  CO2 21*  GLUCOSE 140*  BUN 7  CREATININE 0.79  CALCIUM 9.1   PT/INR No results for input(s): LABPROT, INR in the last 72 hours. ABG No results for input(s): PHART, HCO3 in the last 72 hours.  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    Anti-infectives: Anti-infectives (From admission, onward)   Start     Dose/Rate Route Frequency Ordered Stop   02/05/17 2000  ceFAZolin (ANCEF) IVPB 2g/100 mL premix     2 g 200 mL/hr over 30 Minutes Intravenous Every 8 hours 02/05/17 1447 02/05/17 2033   02/05/17 1015  ceFAZolin (ANCEF) IVPB 2g/100 mL premix     2 g 200 mL/hr over 30 Minutes Intravenous To [facility] 02/04/17 1502 02/05/17 1222      Assessment/Plan: s/p Procedure(s): RIGHT SIMPLE MASTECTOMY WITH AXILLARY SENTINEL NODE BIOPSY (Right) Discharge  LOS: 0 days    Marcello Moores A Mekenzie Modeste 02/06/2017

## 2017-02-12 ENCOUNTER — Telehealth: Payer: Self-pay | Admitting: *Deleted

## 2017-02-12 NOTE — Telephone Encounter (Signed)
Received order for oncotype testing. Requisition sent to pathology 

## 2017-02-19 ENCOUNTER — Telehealth: Payer: Self-pay | Admitting: *Deleted

## 2017-02-19 NOTE — Telephone Encounter (Signed)
Received oncotype score of 8/3%. Physician team notified. Left vm for pt to return call to discuss results. Contact information provided

## 2017-02-23 ENCOUNTER — Telehealth: Payer: Self-pay | Admitting: *Deleted

## 2017-02-23 ENCOUNTER — Encounter (HOSPITAL_COMMUNITY): Payer: Self-pay

## 2017-02-23 NOTE — Telephone Encounter (Signed)
Spoke to pt and discussed oncotype score of 8. Informed pt that she does not need chemotherapy. Confirmed appt with Dr. Jana Hakim on 2/22. Denies further needs.

## 2017-02-26 NOTE — Progress Notes (Signed)
[name]  [phone] [phone]     ID: Doris Maynard DOB: [birth year]  MR#: [id number]  AJG#:[id number]  Patient Care Team: Kristopher Glee., MD as PCP - General (Internal Medicine) Erroll Luna, MD as Consulting Physician (General Surgery) Daimon Kean, Virgie Dad, MD as Consulting Physician (Oncology) Maralyn Sago, MD as Referring Physician (Oncology) OTHER MD:  CHIEF COMPLAINT: estrogen receptor positive breast cancer  CURRENT TREATMENT: Tamoxifen   HISTORY OF CURRENT ILLNESS: From the original intake note:  [name] had routine screening mammography in [facility] on 12/12/2016 showing a possible abnormality in the right breast. She underwent unilateral right diagnostic mammography with tomography and right breast ultrasonography in [facility] on 12/19/2016 showing: breast density category C. Suspicious irregular hypoechoic mass right breast 10 o'clock upper outer position 6 cm from the nipple measuring 1.2 x 0.6 x 0.7 cm, felt to correspond with focal area of distortion on mammography. Additional suspicious right breast mass 1 o'clock upper inner position 4 cm from the nipple measuring 0.9 x 0.7 x 0.7 cm. Multiple cortically thickened right axillary lymph nodes.   Accordingly on 01/01/2017 she proceeded to biopsy of the right breast masses along with lymph node sampling . The pathology from this procedure showed ([id number])  Right breast 10 o'clock position: Invasive mammary carcinoma grade II. At the 1 o'clock position: Invasive mammary carcinoma grade II. E-cadherin stain shows diffuse but weak reactivity amongst the cells, favoring lobular carcinoma. Right axillary lymph node: No evidence of malignancy. Estrogen receptor positive at 99%, progesterone receptor positive at 99%, both with strong positivity, HER-2 not amplified by immunohistochemistry (1+).  She also completed a bilateral breast MRI on 01/16/2017 showing:  Biopsy-proven multicentric invasive carcinomas within the RIGHT breast at the 10 o'clock and 1 o'clock axes, measuring 3.3 cm and 2 cm respectively, with associated biopsy clip artifacts. Additional suspicious enhancing mass within the slightly inner RIGHT breast, at middle depth, measuring 5 mm, located approximately 1.5 cm medial to the biopsy-proven cancer at the 10 o'clock axis, suspected satellite lesion. Additional suspicious enhancing mass within the upper inner quadrant of the RIGHT breast, at anterior depth, 2-3 o'clock axis, measuring 7 mm. No evidence of malignancy within the LEFT breast.  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  The patient's subsequent history is as detailed below.  INTERVAL HISTORY: Doris Maynard returns today for follow up and treatment of her estrogen receptor positive breast cancer.  Her husband is with her today.  She notes that she did well with her surgery. She notes that she only needed pain medication for about 3-4 days. She notes that drains were placed, which was uncomfortable. She denies fevers, redness, or swelling in the area. She notes puffy skin around the incision.  Since her last visit, she underwent a right simple mastectomy and right axillary sentinel lymph node sampling ([id number]) on 02/05/2017 with pathology showing: multifocal invasive lobular carcinoma(3.0 cm, 1.6 cm) . Lobular carcinoma in situ. Margins uninvolved for carcinoma. 4 right  axillary sentinel lymph nodes are negative for carcinoma (0/4).   The Oncotype DX  score was 8, predicting a risk of outside the breast recurrence over the next 10 years of  3% if the patient's only systemic therapy is tamoxifen for 5 years.  It also predicts no significant benefit from chemotherapy   REVIEW OF SYSTEMS: Babbette reports that she is unsure about having reconstruction. She notes that she is still considering her options. For exercise, she goes to the gym and spends 20 minutes on the treadmill. She is planning to take physical therapy classes to increase her range of motion. She denies unusual headaches, visual changes, nausea, vomiting, or dizziness. There has been no unusual cough, phlegm production, or pleurisy. This been no change in bowel or bladder habits. She denies unexplained fatigue or unexplained weight loss, bleeding, rash, or fever. A detailed review of systems was otherwise stable.   PAST MEDICAL HISTORY: Past Medical History:  Diagnosis Date  . Breast cancer, right breast ([place])    S/P mastectomy 02/05/2017  . Endometrial cancer ([place]) 2016    hysterectomy  . Hypertension   . Rosacea    mainly on face  Hysterectomy with BSO with Dr. Claiborne Billings.   PAST SURGICAL HISTORY: Past Surgical History:  Procedure Laterality Date  . ABDOMINAL HYSTERECTOMY  04/2014  . BREAST BIOPSY  2007   core biopsy "think it was on the left breast"  . BREAST BIOPSY Right 12/2016  . DILATION AND CURETTAGE OF UTERUS  1975   S/P miscarriage  . MASTECTOMY COMPLETE / SIMPLE W/ SENTINEL NODE BIOPSY Right 02/05/2017   RIGHT SIMPLE MASTECTOMY WITH AXILLARY SENTINEL NODE BIOPSY  . SIMPLE MASTECTOMY WITH AXILLARY SENTINEL NODE BIOPSY Right 02/05/2017   Procedure: RIGHT SIMPLE MASTECTOMY WITH AXILLARY SENTINEL NODE BIOPSY;  Surgeon: Erroll Luna, MD;  Location: [facility];  Service: General;  Laterality: Right;    FAMILY HISTORY No family history on file.  The patient's father died of colon cancer at age  51. Her mother died at age 28 with a neck condition. The patient had 1 brother who died at age 57 with  Dementia, and she has no sisters. She denies a family history of ovarian or breast cancer.   GYNECOLOGIC HISTORY:  No LMP recorded. Patient has had a hysterectomy. 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 Her husband, Mikki Santee, is retired from the [employer]. She has 2 children, her oldest is daughter, Tressia Miners who is 95 and a Freight forwarder for [employer] in [place]. Sharma's youngest son, Nicki Reaper age 77  is an attorney in [place], [place]. The patient has 2 grandchildren who both live in [place]. She goes to a [employer] in [place], [place]     ADVANCED DIRECTIVES:   HEALTH MAINTENANCE: Social History   Tobacco Use  . Smoking status: Never Smoker  . Smokeless tobacco: Never Used  Substance Use Topics  . Alcohol use: Yes    Comment: 02/05/2017 will drink wine once or twice a month  . Drug use: No     Colonoscopy: 10/30/2014  PAP: status post hysterectomy  Bone density:12/12/2016 T score -3.2 osteoporosis   Allergies  Allergen Reactions  . Clindamycin Diarrhea    ? C.DIFF. ?    Current Outpatient Medications  Medication Sig Dispense Refill  . Cholecalciferol (VITAMIN D3) 5000 units CAPS Take  5,000 Units by mouth daily.    Marland Kitchen doxylamine, Sleep, (SLEEP AID) 25 MG tablet Take 25 mg by mouth at bedtime as needed for sleep.    Marland Kitchen lisinopril (PRINIVIL,ZESTRIL) 20 MG tablet Take 20 mg by mouth daily.    . methocarbamol (ROBAXIN) 500 MG tablet Take 1 tablet (500 mg total) by mouth every 6 (six) hours as needed for muscle spasms. 20 tablet 0  . metroNIDAZOLE (METROCREAM) 0.75 % cream Apply 1 application topically daily as needed (rosacea).    Vladimir Faster Glycol-Propyl  Glycol (SYSTANE OP) Apply 1 drop to eye daily as needed (dry eyes).    . tamoxifen (NOLVADEX) 20 MG tablet Take 1 tablet (20 mg total) by mouth daily. 90 tablet 12   No current facility-administered medications for this visit.     OBJECTIVE: Middle-aged white woman who appears stated age  There were no vitals filed for this visit.   There is no height or weight on file to calculate BMI.   Wt Readings from Last 3 Encounters:  02/05/17 168 lb 8 oz (76.4 kg)  02/02/17 168 lb 8 oz (76.4 kg)  01/27/17 165 lb 12.6 oz (75.2 kg)      ECOG FS:0 - Asymptomatic  Sclerae unicteric, EOMs intact Oropharynx clear and moist No cervical or supraclavicular adenopathy Lungs no rales or rhonchi Heart regular rate and rhythm Abd soft, nontender, positive bowel sounds MSK no focal spinal tenderness, no upper extremity lymphedema Neuro: nonfocal, well oriented, appropriate affect Breasts: Right breast is status post mastectomy.  The incision is healing very nicely, with no dehiscence, erythema, or swelling.  The left breast is benign.  Both axillae are benign  LAB RESULTS:  CMP     Component Value Date/Time   NA 138 02/06/2017 0517   K 4.0 02/06/2017 0517   CL 107 02/06/2017 0517   CO2 21 (L) 02/06/2017 0517   GLUCOSE 140 (H) 02/06/2017 0517   BUN 7 02/06/2017 0517   CREATININE 0.79 02/06/2017 0517   CALCIUM 9.1 02/06/2017 0517   PROT 6.0 (L) 02/06/2017 0517   ALBUMIN 3.5 02/06/2017 0517   AST 20 02/06/2017 0517   ALT 17 02/06/2017 0517   ALKPHOS 73 02/06/2017 0517   BILITOT 0.9 02/06/2017 0517   GFRNONAA >60 02/06/2017 0517   GFRAA >60 02/06/2017 0517    No results found for: TOTALPROTELP, ALBUMINELP, A1GS, A2GS, BETS, BETA2SER, GAMS, MSPIKE, SPEI  No results found for: Nils Pyle, KAPLAMBRATIO  Lab Results  Component Value Date   WBC 9.5 02/06/2017   HGB 12.6 02/06/2017   HCT 38.1 02/06/2017   MCV 91.8 02/06/2017   PLT 227 02/06/2017    _0 @  No  results found for: LABCA2  No components found for: OEVOJJ009  No results for input(s): INR in the last 168 hours.  No results found for: LABCA2  No results found for: FGH829  No results found for: HBZ169  No results found for: CVE938  No results found for: CA2729  No components found for: HGQUANT  No results found for: CEA1 / No results found for: CEA1   No results found for: AFPTUMOR  No results found for: CHROMOGRNA  No results found for: PSA1  No visits with results within 3 Day(s) from this visit.  Latest known visit with results is:  Admission on 02/05/2017, Discharged on 02/06/2017  Component Date Value Ref Range Status  . Sodium 02/06/2017 138  135 - 145 mmol/L Final  . Potassium 02/06/2017 4.0  3.5 -  5.1 mmol/L Final  . Chloride 02/06/2017 107  101 - 111 mmol/L Final  . CO2 02/06/2017 21* 22 - 32 mmol/L Final  . Glucose, Bld 02/06/2017 140* 65 - 99 mg/dL Final  . BUN 02/06/2017 7  6 - 20 mg/dL Final  . Creatinine, Ser 02/06/2017 0.79  0.44 - 1.00 mg/dL Final  . Calcium 02/06/2017 9.1  8.9 - 10.3 mg/dL Final  . Total Protein 02/06/2017 6.0* 6.5 - 8.1 g/dL Final  . Albumin 02/06/2017 3.5  3.5 - 5.0 g/dL Final  . AST 02/06/2017 20  15 - 41 U/L Final  . ALT 02/06/2017 17  14 - 54 U/L Final  . Alkaline Phosphatase 02/06/2017 73  38 - 126 U/L Final  . Total Bilirubin 02/06/2017 0.9  0.3 - 1.2 mg/dL Final  . GFR calc non Af Amer 02/06/2017 >60  >60 mL/min Final  . GFR calc Af Amer 02/06/2017 >60  >60 mL/min Final   Comment: (NOTE) The eGFR has been calculated using the CKD EPI equation. This calculation has not been validated in all clinical situations. eGFR's persistently <60 mL/min signify possible Chronic Kidney Disease.   . Anion gap 02/06/2017 10  5 - 15 Final   Performed at [facility], [place] [address], [place], [place] [address]  . WBC 02/06/2017 9.5  4.0 - 10.5 K/uL Final  . RBC 02/06/2017 4.15  3.87 - 5.11 MIL/uL Final  . Hemoglobin 02/06/2017  12.6  12.0 - 15.0 g/dL Final  . HCT 02/06/2017 38.1  36.0 - 46.0 % Final  . MCV 02/06/2017 91.8  78.0 - 100.0 fL Final  . MCH 02/06/2017 30.4  26.0 - 34.0 pg Final  . MCHC 02/06/2017 33.1  30.0 - 36.0 g/dL Final  . RDW 02/06/2017 12.4  11.5 - 15.5 % Final  . Platelets 02/06/2017 227  150 - 400 K/uL Final   Performed at [facility], [place] [address], [place], [place] [address]    (this displays the last labs from the last 3 days)  No results found for: TOTALPROTELP, ALBUMINELP, A1GS, A2GS, BETS, BETA2SER, GAMS, MSPIKE, SPEI (this displays SPEP labs)  No results found for: KPAFRELGTCHN, LAMBDASER, KAPLAMBRATIO (kappa/lambda light chains)  No results found for: HGBA, HGBA2QUANT, HGBFQUANT, HGBSQUAN (Hemoglobinopathy evaluation)   No results found for: LDH  No results found for: IRON, TIBC, IRONPCTSAT (Iron and TIBC)  No results found for: FERRITIN  Urinalysis No results found for: COLORURINE, APPEARANCEUR, LABSPEC, PHURINE, GLUCOSEU, HGBUR, BILIRUBINUR, KETONESUR, PROTEINUR, UROBILINOGEN, NITRITE, LEUKOCYTESUR   STUDIES: Nm Sentinel Node Inj-no Rpt (breast)  Result Date: 02/05/2017 Sulfur colloid was injected by the nuclear medicine technologist for melanoma sentinel node.    ELIGIBLE FOR AVAILABLE RESEARCH PROTOCOL: no  ASSESSMENT: 72 y.o. [place], [place] Woman status post robot assisted abdominal hysterectomy bilateral salpingo-oophorectomy and bilateral pelvic lymph node dissection 04/13/2014 for a grade 1, stage Ib endometrial cancer; now  (1) s/p Right breast overlapping sites biopsy x2 (different quadrants) on 01/01/2017 for a clinical mT2 N0,  Stage IB invasive lobular carcinoma, grade 2,  estrogen and progesterone receptor positive, HER-2 not amplified  (a) breast MRI on 01/16/2017 shows 2 additional areas of concern in the right breast  (2) right mastectomy with sentinel lymph node sampling 02/05/2017 showed an mT2 N0, stage IB invasive lobular carcinoma, grade  2, with negative margins.  (Total of 4 lymph nodes removed  (3) The Oncotype DX score was 8, predicting a risk of outside the breast recurrence over the  next 10 years of 3% if the patient's only systemic therapy is tamoxifen for 5 years.  It also predicts no significant benefit from chemotherapy.  (4) adjuvant radiation not indicated  (5) tamoxifen started 02/27/2017  (a) bone density at [facility] 12/12/2016 shows a T score of -3.2  (b) status post hysterectomy with bilateral salpingo-oophorectomy  (c) remote history of oral contraceptives with no complications    PLAN: Manuela Schwartz did remarkably well with her surgery.  She does not require adjuvant radiation so she is done with local treatment.  At this point she is not interested and reconstruction but today we did go through the main types of reconstruction so that she can start thinking about whether or not she may be interested in this at some point in the future.  We discussed antiestrogens in detail. She understands that anastrozole and the aromatase inhibitors in general work by blocking estrogen production. Accordingly vaginal dryness, decrease in bone density, and of course hot flashes can result. The aromatase inhibitors can also negatively affect the cholesterol profile, although that is a minor effect. One out of 5 women on aromatase inhibitors we will feel "old and achy". This arthralgia/myalgia syndrome, which resembles fibromyalgia clinically, does resolve with stopping the medications. Accordingly this is not a reason to not try an aromatase inhibitor but it is a frequent reason to stop it (in other words 20% of women will not be able to tolerate these medications).  Tamoxifen on the other hand does not block estrogen production. It does not "take away a woman's estrogen". It blocks the estrogen receptor in breast cells. Like anastrozole, it can also cause hot flashes. As opposed to anastrozole, tamoxifen has many estrogen-like  effects. It is technically an estrogen receptor modulator. This means that in some tissues tamoxifen works like estrogen-- for example it helps strengthen the bones. It tends to improve the cholesterol profile. It can cause thickening of the endometrial lining, and even endometrial polyps or rarely cancer of the uterus.(The risk of uterine cancer due to tamoxifen is one additional cancer per thousand women year). It can cause vaginal wetness or stickiness. It can cause blood clots through this estrogen-like effect--the risk of blood clots with tamoxifen is exactly the same as with birth control pills or hormone replacement.  Neither of these agents causes mood changes or weight gain, despite the popular belief that they can have these side effects. We have data from studies comparing either of these drugs with placebo, and in those cases the control group had the same amount of weight gain and depression as the group that took the drug.  Given all that, we agreed that Cortnee is an excellent candidate for tamoxifen.  She will start that medication today.  I have placed a referral for physical therapy which she should be able to accomplish within the next couple of weeks.  I will see her again in 3 months.  If she is tolerating tamoxifen well at that time I will see her again in 6 months after that, with her next (left screening) mammogram  She knows to call for any other issues that may develop before the next visit.     Jaiden Wahab, Virgie Dad, MD  02/27/17 11:54 AM Medical Oncology and Hematology [facility] [address] [address] [place],  [address] Tel. [phone]    Fax. [phone]  This document serves as a record of services personally performed by Lurline Del, MD. It was created on his behalf by Sharol Given  Beverly Gust, a trained medical scribe. The creation of this record is based on the scribe's personal observations and the provider's statements to them.

## 2017-02-27 ENCOUNTER — Inpatient Hospital Stay: Payer: Medicare Other | Attending: Oncology | Admitting: Oncology

## 2017-02-27 ENCOUNTER — Telehealth: Payer: Self-pay | Admitting: Oncology

## 2017-02-27 DIAGNOSIS — Z170 Estrogen receptor positive status [ER+]: Secondary | ICD-10-CM | POA: Insufficient documentation

## 2017-02-27 DIAGNOSIS — I10 Essential (primary) hypertension: Secondary | ICD-10-CM | POA: Diagnosis not present

## 2017-02-27 DIAGNOSIS — C50811 Malignant neoplasm of overlapping sites of right female breast: Secondary | ICD-10-CM | POA: Insufficient documentation

## 2017-02-27 DIAGNOSIS — Z8542 Personal history of malignant neoplasm of other parts of uterus: Secondary | ICD-10-CM

## 2017-02-27 DIAGNOSIS — Z90710 Acquired absence of both cervix and uterus: Secondary | ICD-10-CM | POA: Diagnosis not present

## 2017-02-27 DIAGNOSIS — Z79899 Other long term (current) drug therapy: Secondary | ICD-10-CM | POA: Diagnosis not present

## 2017-02-27 DIAGNOSIS — L719 Rosacea, unspecified: Secondary | ICD-10-CM | POA: Diagnosis not present

## 2017-02-27 DIAGNOSIS — Z79810 Long term (current) use of selective estrogen receptor modulators (SERMs): Secondary | ICD-10-CM | POA: Diagnosis not present

## 2017-02-27 DIAGNOSIS — Z9011 Acquired absence of right breast and nipple: Secondary | ICD-10-CM | POA: Diagnosis not present

## 2017-02-27 MED ORDER — TAMOXIFEN CITRATE 20 MG PO TABS: 20.0000 mg | ORAL_TABLET | Freq: Every day | ORAL | 12 refills | Status: AC

## 2017-02-27 NOTE — Telephone Encounter (Signed)
Gave patient AVs and calendar of upcoming May appointments.  °

## 2017-03-06 ENCOUNTER — Ambulatory Visit: Payer: Medicare Other | Attending: Oncology | Admitting: Physical Therapy

## 2017-03-06 ENCOUNTER — Other Ambulatory Visit: Payer: Self-pay

## 2017-03-06 DIAGNOSIS — Z483 Aftercare following surgery for neoplasm: Secondary | ICD-10-CM | POA: Diagnosis not present

## 2017-03-06 DIAGNOSIS — R293 Abnormal posture: Secondary | ICD-10-CM

## 2017-03-06 NOTE — Therapy (Signed)
[place], [place], [address] Phone: [phone]   Fax:  [phone]  Physical Therapy Evaluation  Patient Details  Name: Doris Maynard MRN: [id number] Date of Birth: 10-18-1945 Referring Provider: Magrinat    Encounter Date: 03/06/2017  PT End of Session - 03/06/17 1355    Visit Number  1    Number of Visits  9    Date for PT Re-Evaluation  04/06/17    PT Start Time  1020    PT Stop Time  1100    PT Time Calculation (min)  40 min    Activity Tolerance  Patient tolerated treatment well    Behavior During Therapy  [facility] for tasks assessed/performed       Past Medical History:  Diagnosis Date  . Breast cancer, right breast ([place])    S/P mastectomy 02/05/2017  . Endometrial cancer ([place]) 2016    hysterectomy  . Hypertension   . Rosacea    mainly on face    Past Surgical History:  Procedure Laterality Date  . ABDOMINAL HYSTERECTOMY  04/2014  . BREAST BIOPSY  2007   core biopsy "think it was on the left breast"  . BREAST BIOPSY Right 12/2016  . DILATION AND CURETTAGE OF UTERUS  1975   S/P miscarriage  . MASTECTOMY COMPLETE / SIMPLE W/ SENTINEL NODE BIOPSY Right 02/05/2017   RIGHT SIMPLE MASTECTOMY WITH AXILLARY SENTINEL NODE BIOPSY  . SIMPLE MASTECTOMY WITH AXILLARY SENTINEL NODE BIOPSY Right 02/05/2017   Procedure: RIGHT SIMPLE MASTECTOMY WITH AXILLARY SENTINEL NODE BIOPSY;  Surgeon: Erroll Luna, MD;  Location: [place];  Service: General;  Laterality: Right;    There were no vitals filed for this visit.   Subjective Assessment - 03/06/17 1033    Subjective  doing pretty good.  I only notice pain once in a while if I'm reaching or stretching     Pertinent History  Right mastecomyt with 4 nodes removed with no chemo or radiation needed    Patient Stated Goals  to learn what i need to do , wants to get back to playing pickleball and maybe tennis     Currently in Pain?  No/denies          [facility] PT Assessment - 03/06/17 0001      Assessment   Medical Diagnosis  right breast cancer     Referring Provider  Magrinat     Onset Date/Surgical Date  02/05/17    Hand Dominance  Right      Precautions   Precautions  None      Restrictions   Weight Bearing Restrictions  No      Balance Screen   Has the patient fallen in the past 6 months  No    Has the patient had a decrease in activity level because of a fear of falling?   No    Is the patient reluctant to leave their home because of a fear of falling?   No      Home Environment   Living Environment  Private residence    Living Arrangements  Spouse/significant other    Available Help at Discharge  Available PRN/intermittently      Prior Function   Level of [place]  Retired    Leisure  has been walking on treadmill 3 times a week at [employer]   Overall Cognitive Status  Within Functional  Limits for tasks assessed      Observation/Other Assessments   Observations  healing mastectomy incision on chest.  Pt has some loose skin around incsion, but does not appear to have edema She is wearing a racer back sports bra but does not appear to have fullness in back or lateral chest     Skin Integrity  no open areas     Quick DASH   47.73      Sensation   Light Touch  Appears Intact      Coordination   Gross Motor Movements are Fluid and Coordinated  Yes      Posture/Postural Control   Posture/Postural Control  Postural limitations    Postural Limitations  Rounded Shoulders;Forward head      AROM   Right Shoulder Flexion  155 Degrees    Right Shoulder ABduction  170 Degrees    Left Shoulder Flexion  160 Degrees    Left Shoulder ABduction  170 Degrees      Strength   Right Shoulder Flexion  3/5    Right Shoulder ABduction  3/5    Left Shoulder Flexion  4/5    Left Shoulder ABduction  4/5        LYMPHEDEMA/ONCOLOGY QUESTIONNAIRE - 03/06/17 1052      Right  Upper Extremity Lymphedema   10 cm Proximal to Olecranon Process  30.5 cm    Olecranon Process  25.5 cm    15 cm Proximal to Ulnar Styloid Process  25.6 cm    Just Proximal to Ulnar Styloid Process  16.7 cm    Across Hand at [employer]  20 cm    At [place] of 2nd Digit  6.5 cm      Left Upper Extremity Lymphedema   10 cm Proximal to Olecranon Process  30 cm    Olecranon Process  25.5 cm    15 cm Proximal to Ulnar Styloid Process  24.7 cm    Just Proximal to Ulnar Styloid Process  16.5 cm    Across Hand at [employer]  20 cm    At [place] of 2nd Digit  6.5 cm        Quick Dash - 03/06/17 0001    Open a tight or new jar  Severe difficulty    Do heavy household chores (wash walls, wash floors)  Moderate difficulty    Carry a shopping bag or briefcase  Moderate difficulty    Wash your back  Moderate difficulty    Use a knife to cut food  No difficulty    Recreational activities in which you take some force or impact through your arm, shoulder, or hand (golf, hammering, tennis)  Moderate difficulty    During the past week, to what extent has your arm, shoulder or hand problem interfered with your normal social activities with family, friends, neighbors, or groups?  Modererately    During the past week, to what extent has your arm, shoulder or hand problem limited your work or other regular daily activities  Modererately    Arm, shoulder, or hand pain.  Moderate    Tingling (pins and needles) in your arm, shoulder, or hand  Moderate    Difficulty Sleeping  Moderate difficulty    DASH Score  47.73 %       Objective measurements completed on examination: See above findings.      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       Shoulder Exercises: Standing   Other Standing Exercises  standing dowel  exercises with cues to keep core engages and doorway stretch                   PT Long Term Goals - 03/06/17 1400      PT LONG TERM GOAL #1   Title  Pt will be independent in lypmphedema risk reduction practices     Time  4    Period  Weeks    Status  New      PT LONG TERM GOAL #2   Title  Pt will be independent in home exercise program for ROM and Strength    Time  4    Period  Weeks    Status  New      PT LONG TERM GOAL #3   Title  Pt will report that is able to use her arm better in functional activiies     Time  4    Period  Weeks    Status  New             Plan - 03/06/17 1356    Clinical Impression Statement  Pt is doing very well after mastectomy with good range of motion and no signs of lymphedema.  She would benefit from strengthening exercises and education to decrease her risk of developing lymphedema     History and Personal Factors relevant to plan of care:  lives in [facility] so has a drive to our clinic     Clinical Presentation  Stable    Clinical Presentation due to:  no further treatment planned     Clinical Decision Making  Low    Rehab Potential  Good    Clinical Impairments Affecting Rehab Potential  none    PT Frequency  2x / week    PT Duration  4 weeks should not need all session     PT Treatment/Interventions  ADLs/Self Care Home Management;Therapeutic exercise;Patient/family education;Manual lymph drainage;Manual techniques;Taping;Passive range of motion    PT Next Visit Plan  make sure pt is signed up for ABC class. Progress with pulleys, wall stretches teach supine scap.  Next teach Strength ABC program, consider referral to [facility] [place]     Consulted and Agree with Plan of Care  Patient       Patient will benefit from skilled therapeutic intervention in order to improve the following deficits and impairments:  Decreased knowledge of use of DME, Postural dysfunction, Decreased strength, Impaired UE functional  use  Visit Diagnosis: Aftercare following surgery for neoplasm - Plan: PT plan of care cert/re-cert  Abnormal posture - Plan: PT plan of care cert/re-cert     Problem List Patient Active Problem List   Diagnosis Date Noted  . Malignant neoplasm of overlapping sites of right breast in female, estrogen receptor positive ([place]) 02/02/2017   Donato Heinz. Owens Shark PT  Norwood Levo 03/06/2017, 2:03 PM  [place], [place], [address] Phone: [phone]   Fax:  [phone]  Name: Doris Maynard MRN: [id number] Date of Birth: 1945-02-03

## 2017-03-06 NOTE — Patient Instructions (Signed)

## 2017-03-17 ENCOUNTER — Ambulatory Visit: Payer: Medicare Other | Admitting: Physical Therapy

## 2017-03-19 ENCOUNTER — Encounter: Payer: Medicare Other | Admitting: Physical Therapy

## 2017-03-24 ENCOUNTER — Encounter: Payer: Medicare Other | Admitting: Physical Therapy

## 2017-03-26 ENCOUNTER — Ambulatory Visit: Payer: Medicare Other | Admitting: Physical Therapy

## 2017-03-26 ENCOUNTER — Encounter: Payer: Self-pay | Admitting: Physical Therapy

## 2017-03-26 DIAGNOSIS — R293 Abnormal posture: Secondary | ICD-10-CM

## 2017-03-26 DIAGNOSIS — Z483 Aftercare following surgery for neoplasm: Secondary | ICD-10-CM | POA: Diagnosis not present

## 2017-03-26 NOTE — Patient Instructions (Signed)

## 2017-03-26 NOTE — Therapy (Signed)
[place], [place], [address] Phone: [phone]   Fax:  [phone]  Physical Therapy Treatment  Patient Details  Name: Doris Maynard MRN: [id number] Date of Birth: June 17, 1945 Referring Provider: Magrinat    Encounter Date: 03/26/2017  PT End of Session - 03/26/17 1312    Visit Number  2    Number of Visits  9    Date for PT Re-Evaluation  04/06/17    PT Start Time  1102    PT Stop Time  1145    PT Time Calculation (min)  43 min    Activity Tolerance  Patient tolerated treatment well    Behavior During Therapy  [facility] for tasks assessed/performed       Past Medical History:  Diagnosis Date  . Breast cancer, right breast ([place])    S/P mastectomy 02/05/2017  . Endometrial cancer ([place]) 2016    hysterectomy  . Hypertension   . Rosacea    mainly on face    Past Surgical History:  Procedure Laterality Date  . ABDOMINAL HYSTERECTOMY  04/2014  . BREAST BIOPSY  2007   core biopsy "think it was on the left breast"  . BREAST BIOPSY Right 12/2016  . DILATION AND CURETTAGE OF UTERUS  1975   S/P miscarriage  . MASTECTOMY COMPLETE / SIMPLE W/ SENTINEL NODE BIOPSY Right 02/05/2017   RIGHT SIMPLE MASTECTOMY WITH AXILLARY SENTINEL NODE BIOPSY  . SIMPLE MASTECTOMY WITH AXILLARY SENTINEL NODE BIOPSY Right 02/05/2017   Procedure: RIGHT SIMPLE MASTECTOMY WITH AXILLARY SENTINEL NODE BIOPSY;  Surgeon: Erroll Luna, MD;  Location: [facility];  Service: General;  Laterality: Right;    There were no vitals filed for this visit.  Subjective Assessment - 03/26/17 1109    Subjective  Pt has been doing dowel rod exercises  at home and is ready to progress exercises  she says she is using her arm at home with daily acitivites     Pertinent History  Right mastecomyt with 4 nodes removed with no chemo or radiation needed    Patient Stated Goals  to learn what i need to do , wants to get back to playing pickleball and maybe  tennis     Currently in Pain?  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  gave pt information about [employer] and [employer] programs       Lumbar Exercises: Supine   Other Supine Lumbar Exercises  lower trunk rotation with arms at side and goal post arms       Shoulder Exercises: Supine   Other Supine Exercises  supine scapluar series x 5 reps with yellow therabanc       Shoulder Exercises: Pulleys   Flexion  2 minutes    ABduction  2 minutes verbal cues to keep shoulders down and for alignment       Shoulder Exercises: ROM/Strengthening   Wall Wash  at 11:00 and 3:00 with cues to keep body square to feel stretch in trunk and axilla     Ball on Wall  5 reps with hold at end range with deep breath     Other ROM/Strengthening  Exercises  Modified downward dog with muliptle cues for alignment and ways to deepen stretch              PT Education - 03/26/17 1312    Education provided  Yes    Education Details  supine scapular series with yellow theraband     Person(s) Educated  Patient    Methods  Explanation;Demonstration;Handout    Comprehension  Verbalized understanding;Returned demonstration          PT Long Term Goals - 03/26/17 1315      PT LONG TERM GOAL #1   Title  Pt will be independent in lypmphedema risk reduction practices     Time  4    Status  On-going      PT LONG TERM GOAL #2   Title  Pt will be independent in home exercise program for ROM and Strength    Status  On-going      PT LONG TERM GOAL #3   Title  Pt will report that is able to use her arm better in functional activiies     Baseline  Pt states she can use her arm functionally     Status  Achieved            Plan - 03/26/17 1313    Clinical Impression Statement   Pt is progressin well with range of motion and was able to advance to scapular strenthening today.  She will get a compression bra for added support to back and lateral chest. Feel she is ready to learn Strength ABC program next visit     PT Treatment/Interventions  ADLs/Self Care Home Management;Therapeutic exercise;Patient/family education;Manual lymph drainage;Manual techniques;Taping;Passive range of motion    PT Next Visit Plan  make sure pt is signed up for ABC class. or review lymphedema risk reduction handouts Teach Strength ABC program,     Consulted and Agree with Plan of Care  Patient       Patient will benefit from skilled therapeutic intervention in order to improve the following deficits and impairments:     Visit Diagnosis: Aftercare following surgery for neoplasm  Abnormal posture     Problem List Patient Active Problem List   Diagnosis Date Noted  . Malignant neoplasm of overlapping sites of right breast in female, estrogen receptor positive ([place]) 02/02/2017   Donato Heinz. Owens Shark PT  Norwood Levo 03/26/2017, 1:16 PM  [place], [place], [address] Phone: [phone]   Fax:  [phone]  Name: Doris Maynard MRN: [id number] Date of Birth: May 25, 1945

## 2017-04-02 ENCOUNTER — Encounter: Payer: Medicare Other | Admitting: Physical Therapy

## 2017-04-07 ENCOUNTER — Other Ambulatory Visit: Payer: Self-pay

## 2017-04-07 ENCOUNTER — Encounter: Payer: Self-pay | Admitting: Physical Therapy

## 2017-04-07 ENCOUNTER — Ambulatory Visit: Payer: Medicare Other | Attending: Oncology | Admitting: Physical Therapy

## 2017-04-07 DIAGNOSIS — Z483 Aftercare following surgery for neoplasm: Secondary | ICD-10-CM

## 2017-04-07 DIAGNOSIS — R293 Abnormal posture: Secondary | ICD-10-CM | POA: Diagnosis present

## 2017-04-07 NOTE — Therapy (Signed)
[place], [place], [address] Phone: [phone]   Fax:  [phone]  Physical Therapy Treatment  Patient Details  Name: Doris Maynard MRN: [id number] Date of Birth: 1945-01-25 Referring Provider: Magrinat    Encounter Date: 04/07/2017  PT End of Session - 04/07/17 1454    Visit Number  3    Number of Visits  9    Date for PT Re-Evaluation  05/05/17    PT Start Time  1300    PT Stop Time  1657    PT Time Calculation (min)  47 min    Activity Tolerance  Patient tolerated treatment well       Past Medical History:  Diagnosis Date  . Breast cancer, right breast ([place])    S/P mastectomy 02/05/2017  . Endometrial cancer ([place]) 2016    hysterectomy  . Hypertension   . Rosacea    mainly on face    Past Surgical History:  Procedure Laterality Date  . ABDOMINAL HYSTERECTOMY  04/2014  . BREAST BIOPSY  2007   core biopsy "think it was on the left breast"  . BREAST BIOPSY Right 12/2016  . DILATION AND CURETTAGE OF UTERUS  1975   S/P miscarriage  . MASTECTOMY COMPLETE / SIMPLE W/ SENTINEL NODE BIOPSY Right 02/05/2017   RIGHT SIMPLE MASTECTOMY WITH AXILLARY SENTINEL NODE BIOPSY  . SIMPLE MASTECTOMY WITH AXILLARY SENTINEL NODE BIOPSY Right 02/05/2017   Procedure: RIGHT SIMPLE MASTECTOMY WITH AXILLARY SENTINEL NODE BIOPSY;  Surgeon: Erroll Luna, MD;  Location: [facility];  Service: General;  Laterality: Right;    There were no vitals filed for this visit.  Subjective Assessment - 04/07/17 1451    Subjective  Doing well. I think I need to start getting back to the gym.    Pertinent History  Right mastectomy with 4 nodes removed with no chemo or radiation needed    Patient Stated Goals  to learn what i need to do , wants to get back to playing pickleball and maybe tennis     Currently in Pain?  No/denies                               PT Education - 04/07/17 1454    Education  provided  Yes    Education Details  Strength After Breast Cancer packet; education on tai chi and yoga classes she would benefit from    [employer]) Educated  Patient    Methods  Explanation;Demonstration;Tactile cues;Verbal cues;Handout    Comprehension  Verbal cues required;Returned demonstration;Verbalized understanding;Tactile cues required          PT Long Term Goals - 04/07/17 1457      PT LONG TERM GOAL #1   Title  Pt will be independent in lymphedema risk reduction practices     Time  4    Period  Weeks    Status  On-going      PT LONG TERM GOAL #2   Title  Pt will be independent in home exercise program for ROM and Strength    Time  4    Period  Weeks    Status  Partially Met      PT LONG TERM GOAL #3   Title  Pt will report that is able to use her arm better in functional activities     Time  4    Period  Weeks  Status  Achieved      PT LONG TERM GOAL #4   Title  Patient will verbalize good understanding of how to progess her HEP safely to improve strength.    Time  4    Period  Weeks    Status  New            Plan - 04/07/17 1456    Clinical Impression Statement  Patient had good and functional shoulder ROM today. She will benefit from reviewing the Strength ABC exercises to improve body awareness and technique.    Rehab Potential  Good    Clinical Impairments Affecting Rehab Potential  none    PT Frequency  2x / week    PT Duration  4 weeks    PT Treatment/Interventions  ADLs/Self Care Home Management;Therapeutic exercise;Patient/family education;Manual lymph drainage;Manual techniques;Taping;Passive range of motion    PT Next Visit Plan  Review Strength ABC packet; progress core strengthening    PT Home Exercise Plan  Strength ABC    Consulted and Agree with Plan of Care  Patient       Patient will benefit from skilled therapeutic intervention in order to improve the following deficits and impairments:  Decreased knowledge of use of DME, Postural  dysfunction, Decreased strength, Impaired UE functional use  Visit Diagnosis: Aftercare following surgery for neoplasm - Plan: PT plan of care cert/re-cert  Abnormal posture - Plan: PT plan of care cert/re-cert     Problem List Patient Active Problem List   Diagnosis Date Noted  . Malignant neoplasm of overlapping sites of right breast in female, estrogen receptor positive ([place]) 02/02/2017    Annia Friendly, PT 04/07/17 3:00 PM  [place], [place], [address] Phone: [phone]   Fax:  [phone]  Name: Doris Maynard MRN: [id number] Date of Birth: Dec 27, 1945

## 2017-04-07 NOTE — Patient Instructions (Signed)
Instructed patient with Strength After Breast Cancer packet of exercises. PT performed each exercises and then patient returned demo with verbal and tactile cues for proper posture and techniques. Verbal cues given to avoid compensatory techniques. Verbal information also given explaining which muscle groups were being used and targeted with each exercise.

## 2017-04-14 ENCOUNTER — Ambulatory Visit: Payer: Medicare Other | Admitting: Physical Therapy

## 2017-04-14 ENCOUNTER — Encounter: Payer: Self-pay | Admitting: Physical Therapy

## 2017-04-14 DIAGNOSIS — Z483 Aftercare following surgery for neoplasm: Secondary | ICD-10-CM

## 2017-04-14 DIAGNOSIS — R293 Abnormal posture: Secondary | ICD-10-CM

## 2017-04-14 NOTE — Therapy (Signed)
[place], [place], [address] Phone: [phone]   Fax:  [phone]  Physical Therapy Treatment  Patient Details  Name: Doris Maynard MRN: [id number] Date of Birth: 09-30-1945 Referring Provider: Magrinat    Encounter Date: 04/14/2017  PT End of Session - 04/14/17 1234    Visit Number  4    Number of Visits  9    Date for PT Re-Evaluation  05/05/17    PT Start Time  1110    PT Stop Time  1152    PT Time Calculation (min)  42 min    Activity Tolerance  Patient tolerated treatment well    Behavior During Therapy  [facility] for tasks assessed/performed       Past Medical History:  Diagnosis Date  . Breast cancer, right breast ([place])    S/P mastectomy 02/05/2017  . Endometrial cancer ([place]) 2016    hysterectomy  . Hypertension   . Rosacea    mainly on face    Past Surgical History:  Procedure Laterality Date  . ABDOMINAL HYSTERECTOMY  04/2014  . BREAST BIOPSY  2007   core biopsy "think it was on the left breast"  . BREAST BIOPSY Right 12/2016  . DILATION AND CURETTAGE OF UTERUS  1975   S/P miscarriage  . MASTECTOMY COMPLETE / SIMPLE W/ SENTINEL NODE BIOPSY Right 02/05/2017   RIGHT SIMPLE MASTECTOMY WITH AXILLARY SENTINEL NODE BIOPSY  . SIMPLE MASTECTOMY WITH AXILLARY SENTINEL NODE BIOPSY Right 02/05/2017   Procedure: RIGHT SIMPLE MASTECTOMY WITH AXILLARY SENTINEL NODE BIOPSY;  Surgeon: Erroll Luna, MD;  Location: [facility];  Service: General;  Laterality: Right;    There were no vitals filed for this visit.  Subjective Assessment - 04/14/17 1114    Subjective  Pt states she has been doing the Strength ABC program at home. She is using 2 pounds at about 6 or 7 reps She feels like she would like to review some stretches and and the "birddog" exercise     Pertinent History  Right mastectomy with 4 nodes removed with no chemo or radiation needed    Patient Stated Goals  to learn what i need to do ,  wants to get back to playing pickleball and maybe tennis     Currently in Pain?  No/denies                       [facility] Adult PT Treatment/Exercise - 04/14/17 0001      Self-Care   Other Self-Care Comments   answered pt questions about using a compression sleeve for flying. Gave pt a script for [place] to take to [facility] to provide a class one sleeve and gauntlet for prophylaxis       Exercises   Exercises  Other Exercises    Other Exercises   reinforced progression sequence of exercises  with goal of 3 sets of 10 and then progress weight       Lumbar Exercises: Standing   Other Standing Lumbar Exercises  modified birddog with both arms on arms of chair and lift one leg up behind and hold for count of 5 with core engaged 5 reps on each side, then opposit arm and leg up 2 reps on each side.   Pt feels like she will be able to do this modification       Lumbar Exercises: Supine   Pelvic Tilt  5 reps    Clam  5 reps    Clam Limitations  pt performed unilaterall on each side with cues to "drag" leg back in to engage lower core     Bent Knee Raise  10 reps    Bent Knee Raise Limitations  5 reps unilaterally, then one up, other up, first down second down with cues to maintain core actiity the whole time     Bridge  5 reps    Other Supine Lumbar Exercises  lower trunk rotation with arms at side and goal post arms       Lumbar Exercises: Sidelying   Clam  Right;Left;5 reps      Lumbar Exercises: Prone   Other Prone Lumbar Exercises  mini pland on forearms for 5 sec.       Knee/Hip Exercises: Stretches   Sports administrator  Right;Left;1 rep;30 seconds    Quad Stretch Limitations  verbal cues to show pt how to stretch by sitting sideways in chair and squeezing glutes to get more forward hip movment to deepen stretch       Shoulder Exercises: Supine   Other Supine Exercises  chest press x 2 sets of 10       Shoulder Exercises: Standing   Other Standing Exercises  modified  downward dog             PT Education - 04/14/17 1233    Education provided  Yes    Education Details  where to get a prophylactic compression sleeve provided by [employer].     Person(s) Educated  Patient    Methods  Explanation;Handout    Comprehension  Verbalized understanding          PT Long Term Goals - 04/07/17 1457      PT LONG TERM GOAL #1   Title  Pt will be independent in lymphedema risk reduction practices     Time  4    Period  Weeks    Status  On-going      PT LONG TERM GOAL #2   Title  Pt will be independent in home exercise program for ROM and Strength    Time  4    Period  Weeks    Status  Partially Met      PT LONG TERM GOAL #3   Title  Pt will report that is able to use her arm better in functional activities     Time  4    Period  Weeks    Status  Achieved      PT LONG TERM GOAL #4   Title  Patient will verbalize good understanding of how to progess her HEP safely to improve strength.    Time  4    Period  Weeks    Status  New            Plan - 04/14/17 1234    Clinical Impression Statement  Pt continues to improve, but needs continues instruction in proper techinque of exercise for stretching and core strength.  She benefited from upgrades and modifications with verbal and tactile cues for exercise today and feels she will be able to incorporate those at home.  she is coming for ABC class next week and wants to continue to work on more core strengthening.     Rehab Potential  Good    Clinical Impairments Affecting Rehab Potential  none    PT Treatment/Interventions  ADLs/Self Care Home Management;Therapeutic exercise;Patient/family education;Manual lymph drainage;Manual techniques;Taping;Passive range of motion  PT Next Visit Plan  Review standing birddog modification  progress core strengthening  provide written handout for transvere abdominal strength     Consulted and Agree with Plan of Care  Patient       Patient will benefit  from skilled therapeutic intervention in order to improve the following deficits and impairments:  Decreased knowledge of use of DME, Postural dysfunction, Decreased strength, Impaired UE functional use  Visit Diagnosis: Aftercare following surgery for neoplasm  Abnormal posture     Problem List Patient Active Problem List   Diagnosis Date Noted  . Malignant neoplasm of overlapping sites of right breast in female, estrogen receptor positive ([place]) 02/02/2017   Donato Heinz. Owens Shark PT  Norwood Levo 04/14/2017, 12:49 PM  [place], [place], [address] Phone: [phone]   Fax:  [phone]  Name: Doris Maynard MRN: [id number] Date of Birth: 1945/05/18

## 2017-04-14 NOTE — Patient Instructions (Signed)
First of all, check with your insurance company to see if provider is in network    [facility]   (for wigs and compression sleeves / gloves/gauntlets )  [address] [place], [place] [address] [phone]  Will file some insurances --- call for appointment   [facility] (for mastectomy prosthetics and garments) [address] [place], [place] [address] [phone] Will file some insurances --- call for appointment  [facility]  [address]  [place], [place] [address] [phone] Lower extremity garments  [facility] [address] [place], [place]  [address] [phone]  [employer] Sales rep:  Matt Lawson:  [phone] [email or web address] Biocompression pumps   [employer]  Sales rep: Robert Rollins:  [phone] [email or web address] Entre and Flexitouch pumps    Other Resources: National Lymphedema Network:  www.lymphnet.org [email or web address] for patient articles and purchase a self manual lymph drainage DVD [email or web address] has informative articles.  

## 2017-04-20 ENCOUNTER — Ambulatory Visit: Payer: Medicare Other

## 2017-04-20 DIAGNOSIS — Z483 Aftercare following surgery for neoplasm: Secondary | ICD-10-CM

## 2017-04-20 DIAGNOSIS — R293 Abnormal posture: Secondary | ICD-10-CM

## 2017-04-20 NOTE — Therapy (Addendum)
[place], [place], [address] Phone: [phone]   Fax:  [phone]  Physical Therapy Treatment  Patient Details  Name: Doris Maynard MRN: [id number] Date of Birth: [birth year] Referring Provider: Magrinat    Encounter Date: 04/20/2017  PT End of Session - 04/20/17 1100    Visit Number  5    Number of Visits  9    Date for PT Re-Evaluation  05/05/17    PT Start Time  1020    PT Stop Time  1100    PT Time Calculation (min)  40 min    Activity Tolerance  Patient tolerated treatment well    Behavior During Therapy  [facility] for tasks assessed/performed       Past Medical History:  Diagnosis Date  . Breast cancer, right breast ([place])    S/P mastectomy 02/05/2017  . Endometrial cancer ([place]) 2016    hysterectomy  . Hypertension   . Rosacea    mainly on face    Past Surgical History:  Procedure Laterality Date  . ABDOMINAL HYSTERECTOMY  04/2014  . BREAST BIOPSY  2007   core biopsy "think it was on the left breast"  . BREAST BIOPSY Right 12/2016  . DILATION AND CURETTAGE OF UTERUS  1975   S/P miscarriage  . MASTECTOMY COMPLETE / SIMPLE W/ SENTINEL NODE BIOPSY Right 02/05/2017   RIGHT SIMPLE MASTECTOMY WITH AXILLARY SENTINEL NODE BIOPSY  . SIMPLE MASTECTOMY WITH AXILLARY SENTINEL NODE BIOPSY Right 02/05/2017   Procedure: RIGHT SIMPLE MASTECTOMY WITH AXILLARY SENTINEL NODE BIOPSY;  Surgeon: Erroll Luna, MD;  Location: [facility];  Service: General;  Laterality: Right;    There were no vitals filed for this visit.                    [facility] Adult PT Treatment/Exercise - 04/20/17 0001      Lumbar Exercises: Supine   Pelvic Tilt  10 reps    Clam  10 reps With pelvic tilt and then bridge    Heel Slides  10 reps with pelvic tilt    Bent Knee Raise  10 reps With pelvic tilt and bridge    Bridge  10 reps    Single Leg Bridge  Other (comment) Very challenging so stopped              PT Education - 04/20/17 1055    Education provided  Yes    Education Details  Progressed core strenghtening    Person(s) Educated  Patient    Methods  Explanation;Demonstration;Handout    Comprehension  Verbalized understanding;Returned demonstration          PT Long Term Goals - 04/20/17 1055      PT LONG TERM GOAL #1   Title  Pt will be independent in lymphedema risk reduction practices     Baseline  Pt attended ABC class after visit-04/20/17    Status  Achieved      PT LONG TERM GOAL #2   Title  Pt will be independent in home exercise program for ROM and Strength    Status  Achieved      PT LONG TERM GOAL #3   Title  Pt will report that is able to use her arm better in functional activities     Baseline  Pt states she can use her arm functionally     Status  Achieved      PT LONG TERM GOAL #4  Title  Patient will verbalize good understanding of how to progess her HEP safely to improve strength.    Status  Achieved            Plan - 04/20/17 1101    Clinical Impression Statement  Pt has done excellent with therapy. She came in today having done all HEP issued thus far and did well with review of birddog stance for replacement of quadruped in Strength ABC program. Also did well with review of scapular series (does in standing so showed her to do against wall with core engaged) after minor VCs for correct UE positioning. Progressed HEP to include core strengthening today as well which she tolerated very well after minor VCs and demonstration for correct abdominal engagement. Pt will attend ABC class today after session and then will have met all goasl and reports feeling ready to D/C at this time.     Rehab Potential  Good    Clinical Impairments Affecting Rehab Potential  none    PT Frequency  2x / week    PT Duration  4 weeks    PT Treatment/Interventions  ADLs/Self Care Home Management;Therapeutic exercise;Patient/family education;Manual lymph  drainage;Manual techniques;Taping;Passive range of motion    PT Next Visit Plan  D/C this visit.     Consulted and Agree with Plan of Care  Patient       Patient will benefit from skilled therapeutic intervention in order to improve the following deficits and impairments:  Decreased knowledge of use of DME, Postural dysfunction, Decreased strength, Impaired UE functional use  Visit Diagnosis: Abnormal posture  Aftercare following surgery for neoplasm     Problem List Patient Active Problem List   Diagnosis Date Noted  . Malignant neoplasm of overlapping sites of right breast in female, estrogen receptor positive ([place]) 02/02/2017    Otelia Limes, PTA 04/20/2017, 12:22 PM  [place], [place], [address] Phone: [phone]   Fax:  [phone]  Name: Doris Maynard MRN: [id number] Date of Birth: Sep 27, 1945  PHYSICAL THERAPY DISCHARGE SUMMARY  Visits from Start of Care: 5  Current functional level related to goals / functional outcomes: See above   Remaining deficits: See above   Education / Equipment: See above  Plan: Patient agrees to discharge.  Patient goals were met. Patient is being discharged due to meeting the stated rehab goals.  ?????    Allyson Sabal [place], [place] 04/20/17 12:56 PM

## 2017-04-20 NOTE — Patient Instructions (Addendum)
Bridge Pose    Lift into bridge tightening butt muscles. Hold for __3__ breaths. Repeat __10__ times.  Then with holding bridge:  1. Open/close knees like a clam.  2. Alternate marching  10 times each if able holding hips level.  PELVIC TILT: Posterior    Tighten abdominals, flatten low back. Pulling belly button towards your back. _10-20__ reps, 1-2 times a day.  Then can bring knees in line with hips, and bring legs to chest by doing pelvic tilt keeping legs elevated.     1. Can also do with knees/open close like a clam. 2. Alternate marching 3. Straightening leg then bringing it back, and switch pulling toes toward you throughout.   10 times each.

## 2017-05-12 ENCOUNTER — Telehealth: Payer: Self-pay | Admitting: Physical Therapy

## 2017-05-12 NOTE — Telephone Encounter (Signed)
Pt called and requested a call back for the name of the store to get her compression sleeve.  Pt was happy to get information and will get her sleeve. Doris Maynard, PT 05/12/2017@ 5:14 PM

## 2017-05-28 ENCOUNTER — Other Ambulatory Visit: Payer: Medicare Other

## 2017-05-28 ENCOUNTER — Ambulatory Visit: Payer: Medicare Other | Admitting: Oncology

## 2017-06-10 NOTE — Progress Notes (Signed)
[place]  [phone] [phone]     ID: Doris Maynard DOB: [birth year]  MR#: [id number]  XLK#:[id number]  Patient Care Team: Doris Maynard., MD as PCP - General (Internal Medicine) Doris Luna, MD as Consulting Physician (General Surgery) Doris Maynard, Doris Dad, MD as Consulting Physician (Oncology) Doris Sago, MD as Referring Physician (Oncology) OTHER MD:  CHIEF COMPLAINT: estrogen receptor positive breast cancer; endometrial cancer  CURRENT TREATMENT: Tamoxifen   HISTORY OF CURRENT ILLNESS: From the original intake note:  Doris Maynard had routine screening mammography in [facility] on 12/12/2016 showing a possible abnormality in the right breast. She underwent unilateral right diagnostic mammography with tomography and right breast ultrasonography in [facility] on 12/19/2016 showing: breast density category C. Suspicious irregular hypoechoic mass right breast 10 o'clock upper outer position 6 cm from the nipple measuring 1.2 x 0.6 x 0.7 cm, felt to correspond with focal area of distortion on mammography. Additional suspicious right breast mass 1 o'clock upper inner position 4 cm from the nipple measuring 0.9 x 0.7 x 0.7 cm. Multiple cortically thickened right axillary lymph nodes.   Accordingly on 01/01/2017 she proceeded to biopsy of the right breast masses along with lymph node sampling . The pathology from this procedure showed ([id number])  Right breast 10 o'clock position: Invasive mammary carcinoma grade II. At the 1 o'clock position: Invasive mammary carcinoma grade II. E-cadherin stain shows diffuse but weak reactivity amongst the cells, favoring lobular carcinoma. Right axillary lymph node: No evidence of malignancy. Estrogen receptor positive at 99%, progesterone receptor positive at 99%, both with strong positivity, HER-2 not amplified by immunohistochemistry (1+).  She also completed a bilateral breast MRI on 01/16/2017  showing: Biopsy-proven multicentric invasive carcinomas within the RIGHT breast at the 10 o'clock and 1 o'clock axes, measuring 3.3 cm and 2 cm respectively, with associated biopsy clip artifacts. Additional suspicious enhancing mass within the slightly inner RIGHT breast, at middle depth, measuring 5 mm, located approximately 1.5 cm medial to the biopsy-proven cancer at the 10 o'clock axis, suspected satellite lesion. Additional suspicious enhancing mass within the upper inner quadrant of the RIGHT breast, at anterior depth, 2-3 o'clock axis, measuring 7 mm. No evidence of malignancy within the LEFT breast.  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  The patient's subsequent history is as detailed below.  INTERVAL HISTORY: Doris Maynard returns today for follow up and treatment of her estrogen receptor positive breast cancer. She continues on tamoxifen, with good tolerance. She denies having hot flashes or increased vaginal discharge.    REVIEW OF SYSTEMS: Doris Maynard reports that she is doing well. She doesn't think of her self as having breast cancer anymore, execept when she looks at herself in the mirror. She has facial flushing occasionally due to her rocasea. For exercise, she going to [employer] exercise classes at least twice per week. She also plans to start playing pickle ball in July. She denies unusual headaches, visual changes, nausea, vomiting, or dizziness. There has been no unusual  cough, phlegm production, or pleurisy. This been no change in bowel  or bladder habits. She denies unexplained fatigue or unexplained weight loss, bleeding, rash, or fever. A detailed review of systems was otherwise stable.    PAST MEDICAL HISTORY: Past Medical History:  Diagnosis Date  . Breast cancer, right breast ([place])    S/P mastectomy 02/05/2017  . Endometrial cancer ([place]) 2016    hysterectomy  . Hypertension   . Rosacea    mainly on face  Hysterectomy with BSO with Dr. Claiborne Billings.   PAST SURGICAL HISTORY: Past Surgical History:  Procedure Laterality Date  . ABDOMINAL HYSTERECTOMY  04/2014  . BREAST BIOPSY  2007   core biopsy "think it was on the left breast"  . BREAST BIOPSY Right 12/2016  . DILATION AND CURETTAGE OF UTERUS  1975   S/P miscarriage  . MASTECTOMY COMPLETE / SIMPLE W/ SENTINEL NODE BIOPSY Right 02/05/2017   RIGHT SIMPLE MASTECTOMY WITH AXILLARY SENTINEL NODE BIOPSY  . SIMPLE MASTECTOMY WITH AXILLARY SENTINEL NODE BIOPSY Right 02/05/2017   Procedure: RIGHT SIMPLE MASTECTOMY WITH AXILLARY SENTINEL NODE BIOPSY;  Surgeon: Doris Luna, MD;  Location: [place];  Service: General;  Laterality: Right;    FAMILY HISTORY No family history on file.  The patient's father died of colon cancer at age 71. Her mother died at age 84 with a neck condition. The patient had 1 brother who died at age 50 with  Dementia, and she has no sisters. She denies a family history of ovarian or breast cancer.   GYNECOLOGIC HISTORY:  No LMP recorded. Patient has had a hysterectomy. 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 Her husband, Doris Maynard, is retired from the [employer]. She has 2 children, her oldest is daughter, Doris Maynard who is 62 and a Freight forwarder for [employer] in [place]. Doris Maynard's  youngest son, Doris Maynard age 28  is an attorney in [place], [place]. The patient has 2 grandchildren who both live in [place]. She goes to a [employer] in [place], [place]     ADVANCED DIRECTIVES:   HEALTH MAINTENANCE: Social History   Tobacco Use  . Smoking status: Never Smoker  . Smokeless tobacco: Never Used  Substance Use Topics  . Alcohol use: Yes    Comment: 02/05/2017 will drink wine once or twice a month  . Drug use: No     Colonoscopy: 10/30/2014  PAP: status post hysterectomy  Bone density:12/12/2016 T score -3.2 osteoporosis   Allergies  Allergen Reactions  . Clindamycin Diarrhea    ? C.DIFF. ?    Current Outpatient Medications  Medication Sig Dispense Refill  . Cholecalciferol (VITAMIN D3) 5000 units CAPS Take 5,000 Units by mouth daily.    Marland Kitchen doxylamine, Sleep, (SLEEP AID) 25 MG tablet Take 25 mg by mouth at bedtime as needed for sleep.    Marland Kitchen lisinopril (PRINIVIL,ZESTRIL) 20 MG tablet Take 20 mg by mouth daily.    . methocarbamol (ROBAXIN) 500 MG tablet Take 1 tablet (500 mg total) by mouth every 6 (six) hours as needed for muscle spasms. (Patient not taking: Reported on 03/06/2017) 20 tablet 0  . metroNIDAZOLE (METROCREAM) 0.75 % cream Apply 1 application topically daily as needed (rosacea).    . minocycline (DYNACIN) 100 MG tablet Take 100 mg by mouth 2 (two) times daily.    Vladimir Faster  Glycol-Propyl Glycol (SYSTANE OP) Apply 1 drop to eye daily as needed (dry eyes).     No current facility-administered medications for this visit.     OBJECTIVE: Middle-aged white woman in no acute distress  Vitals:   06/11/17 0920  BP: (!) 149/72  Pulse: 93  Resp: 18  Temp: 98.1 F (36.7 C)  SpO2: 100%     Body mass index is 28.32 kg/m.   Wt Readings from Last 3 Encounters:  06/11/17 165 lb (74.8 kg)  02/05/17 168 lb 8 oz (76.4 kg)  02/02/17 168 lb 8 oz (76.4 kg)      ECOG FS:0 - Asymptomatic  Sclerae unicteric, pupils round and equal No cervical or  supraclavicular adenopathy Lungs no rales or rhonchi Heart regular rate and rhythm Abd soft, nontender, positive bowel sounds MSK no focal spinal tenderness, no upper extremity lymphedema Neuro: nonfocal, well oriented, appropriate affect Breasts: Status post right mastectomy, with no evidence of local recurrence.  The left breast is unremarkable.  Both axillae are benign.  LAB RESULTS:  CMP     Component Value Date/Time   NA 138 02/06/2017 0517   K 4.0 02/06/2017 0517   CL 107 02/06/2017 0517   CO2 21 (L) 02/06/2017 0517   GLUCOSE 140 (H) 02/06/2017 0517   BUN 7 02/06/2017 0517   CREATININE 0.79 02/06/2017 0517   CALCIUM 9.1 02/06/2017 0517   PROT 6.0 (L) 02/06/2017 0517   ALBUMIN 3.5 02/06/2017 0517   AST 20 02/06/2017 0517   ALT 17 02/06/2017 0517   ALKPHOS 73 02/06/2017 0517   BILITOT 0.9 02/06/2017 0517   GFRNONAA >60 02/06/2017 0517   GFRAA >60 02/06/2017 0517    No results found for: TOTALPROTELP, ALBUMINELP, A1GS, A2GS, BETS, BETA2SER, GAMS, MSPIKE, SPEI  No results found for: KPAFRELGTCHN, LAMBDASER, [facility]  Lab Results  Component Value Date   WBC 4.6 06/11/2017   NEUTROABS 2.6 06/11/2017   HGB 13.2 06/11/2017   HCT 39.6 06/11/2017   MCV 89.6 06/11/2017   PLT 216 06/11/2017    _0 @  No results found for: LABCA2  No components found for: UXLKGM010  No results for input(s): INR in the last 168 hours.  No results found for: LABCA2  No results found for: UVO536  No results found for: UYQ034  No results found for: VQQ595  No results found for: CA2729  No components found for: HGQUANT  No results found for: CEA1 / No results found for: CEA1   No results found for: AFPTUMOR  No results found for: CHROMOGRNA  No results found for: PSA1  Appointment on 06/11/2017  Component Date Value Ref Range Status  . WBC 06/11/2017 4.6  3.9 - 10.3 K/uL Final  . RBC 06/11/2017 4.42  3.70 - 5.45 MIL/uL Final  . Hemoglobin 06/11/2017 13.2   11.6 - 15.9 g/dL Final  . HCT 06/11/2017 39.6  34.8 - 46.6 % Final  . MCV 06/11/2017 89.6  79.5 - 101.0 fL Final  . MCH 06/11/2017 29.9  25.1 - 34.0 pg Final  . MCHC 06/11/2017 33.4  31.5 - 36.0 g/dL Final  . RDW 06/11/2017 12.8  11.2 - 14.5 % Final  . Platelets 06/11/2017 216  145 - 400 K/uL Final  . Neutrophils Relative % 06/11/2017 56  % Final  . Neutro Abs 06/11/2017 2.6  1.5 - 6.5 K/uL Final  . Lymphocytes Relative 06/11/2017 29  % Final  . Lymphs Abs 06/11/2017 1.3  0.9 - 3.3 K/uL Final  . Monocytes  Relative 06/11/2017 9  % Final  . Monocytes Absolute 06/11/2017 0.4  0.1 - 0.9 K/uL Final  . Eosinophils Relative 06/11/2017 4  % Final  . Eosinophils Absolute 06/11/2017 0.2  0.0 - 0.5 K/uL Final  . Basophils Relative 06/11/2017 2  % Final  . Basophils Absolute 06/11/2017 0.1  0.0 - 0.1 K/uL Final   Performed at [facility], [place] [address] [place], [place] [address]    (this displays the last labs from the last 3 days)  No results found for: TOTALPROTELP, ALBUMINELP, A1GS, A2GS, BETS, BETA2SER, GAMS, MSPIKE, SPEI (this displays SPEP labs)  No results found for: KPAFRELGTCHN, LAMBDASER, KAPLAMBRATIO (kappa/lambda light chains)  No results found for: HGBA, HGBA2QUANT, HGBFQUANT, HGBSQUAN (Hemoglobinopathy evaluation)   No results found for: LDH  No results found for: IRON, TIBC, IRONPCTSAT (Iron and TIBC)  No results found for: FERRITIN  Urinalysis No results found for: COLORURINE, APPEARANCEUR, LABSPEC, PHURINE, GLUCOSEU, HGBUR, BILIRUBINUR, KETONESUR, PROTEINUR, UROBILINOGEN, NITRITE, LEUKOCYTESUR   STUDIES: No results found.  ELIGIBLE FOR AVAILABLE RESEARCH PROTOCOL: no  ASSESSMENT: 72 y.o. [place], [place] Woman status post robot assisted abdominal hysterectomy bilateral salpingo-oophorectomy and bilateral pelvic lymph node dissection 04/13/2014 for a grade 1, stage Ib endometrial cancer; now with a history of breast cancer as follows:  (1)  s/p Right breast overlapping sites biopsy x2 (different quadrants) on 01/01/2017 for a clinical mT2 N0,  Stage IB invasive lobular carcinoma, grade 2,  estrogen and progesterone receptor positive, HER-2 not amplified  (a) breast MRI on 01/16/2017 shows 2 additional areas of concern in the right breast  (2) right mastectomy with sentinel lymph node sampling 02/05/2017 showed an mT2 N0, stage IB invasive lobular carcinoma, grade 2, with negative margins.  (Total of 4 lymph nodes removed  (3) The Oncotype DX score was 8, predicting a risk of outside the breast recurrence over the next 10 years of 3% if the patient's only systemic therapy is tamoxifen for 5 years.  It also predicts no significant benefit from chemotherapy.  (4) adjuvant radiation not indicated  (5) tamoxifen started 02/27/2017  (a) bone density at [facility] 12/12/2016 shows a T score of -3.2  (b) status post hysterectomy with bilateral salpingo-oophorectomy  (c) remote history of oral contraceptives with no complications      PLAN: Doris Maynard is tolerating tamoxifen remarkably well and the plan will be to continue that a total of 5 years.  She had some questions regarding the lateral aspect of her breast infusion where there is a small "dog ear".  This is a little bit of extra fat which if removed might look a little bit better but on the other hand would restrict her range of motion on the right upper extremity and she really wants to be able to extend that arm because she plans to play pickle ball in the near future.  Accordingly we are not doing any further plastic work there.  She does have appropriate bras and prostheses.  She would like to change all her medical care to [facility].  I have put her in to have her left mammogram at the [facility] in early December.  She also would like to have her gynecologic follow-up for her endometrial cancer here and I am setting her up to see Dr.Phelps in November which is when her next  appointment would be due.  Otherwise she knows to call for any issues that may develop before her next visit.    Trampas Stettner, Sarajane Jews  C, MD  06/11/17 9:38 AM Medical Oncology and Hematology [facility] [address] [place], [place] [address] Tel. [phone]    Fax. [phone]  Alice Rieger, am acting as scribe for Chauncey Cruel MD.  I, Lurline Del MD, have reviewed the above documentation for accuracy and completeness, and I agree with the above.

## 2017-06-11 ENCOUNTER — Telehealth: Payer: Self-pay | Admitting: Oncology

## 2017-06-11 ENCOUNTER — Encounter: Payer: Self-pay | Admitting: Oncology

## 2017-06-11 ENCOUNTER — Inpatient Hospital Stay: Payer: Medicare Other | Attending: Oncology

## 2017-06-11 ENCOUNTER — Inpatient Hospital Stay: Payer: Medicare Other | Admitting: Oncology

## 2017-06-11 VITALS — BP 149/72 | HR 93 | Temp 98.1°F | Resp 18 | Ht 64.0 in | Wt 165.0 lb

## 2017-06-11 DIAGNOSIS — Z79810 Long term (current) use of selective estrogen receptor modulators (SERMs): Secondary | ICD-10-CM | POA: Insufficient documentation

## 2017-06-11 DIAGNOSIS — Z9011 Acquired absence of right breast and nipple: Secondary | ICD-10-CM | POA: Insufficient documentation

## 2017-06-11 DIAGNOSIS — C541 Malignant neoplasm of endometrium: Secondary | ICD-10-CM | POA: Insufficient documentation

## 2017-06-11 DIAGNOSIS — C50812 Malignant neoplasm of overlapping sites of left female breast: Secondary | ICD-10-CM | POA: Diagnosis not present

## 2017-06-11 DIAGNOSIS — Z170 Estrogen receptor positive status [ER+]: Secondary | ICD-10-CM

## 2017-06-11 DIAGNOSIS — Z90710 Acquired absence of both cervix and uterus: Secondary | ICD-10-CM | POA: Diagnosis not present

## 2017-06-11 DIAGNOSIS — C50811 Malignant neoplasm of overlapping sites of right female breast: Secondary | ICD-10-CM

## 2017-06-11 DIAGNOSIS — Z90722 Acquired absence of ovaries, bilateral: Secondary | ICD-10-CM | POA: Insufficient documentation

## 2017-06-11 LAB — COMPREHENSIVE METABOLIC PANEL
ALK PHOS: 62 U/L (ref 40–150)
ALT: 14 U/L (ref 0–55)
ANION GAP: 10 (ref 3–11)
AST: 18 U/L (ref 5–34)
Albumin: 4.1 g/dL (ref 3.5–5.0)
BUN: 12 mg/dL (ref 7–26)
CALCIUM: 9.2 mg/dL (ref 8.4–10.4)
CO2: 23 mmol/L (ref 22–29)
Chloride: 108 mmol/L (ref 98–109)
Creatinine, Ser: 0.89 mg/dL (ref 0.60–1.10)
GFR calc Af Amer: >60 mL/min (ref >60)
Glucose, Bld: 95 mg/dL (ref 70–140)
POTASSIUM: 4.1 mmol/L (ref 3.5–5.1)
Sodium: 141 mmol/L (ref 136–145)
TOTAL PROTEIN: 6.9 g/dL (ref 6.4–8.3)
Total Bilirubin: 0.6 mg/dL (ref 0.2–1.2)

## 2017-06-11 LAB — CBC WITH DIFFERENTIAL/PLATELET
Basophils Absolute: 0.1 10*3/uL (ref 0.0–0.1)
Basophils Relative: 2 %
Eosinophils Absolute: 0.2 10*3/uL (ref 0.0–0.5)
Eosinophils Relative: 4 %
HCT: 39.6 % (ref 34.8–46.6)
HEMOGLOBIN: 13.2 g/dL (ref 11.6–15.9)
LYMPHS PCT: 29 %
Lymphs Abs: 1.3 10*3/uL (ref 0.9–3.3)
MCH: 29.9 pg (ref 25.1–34.0)
MCHC: 33.4 g/dL (ref 31.5–36.0)
MCV: 89.6 fL (ref 79.5–101.0)
MONO ABS: 0.4 10*3/uL (ref 0.1–0.9)
MONOS PCT: 9 %
NEUTROS PCT: 56 %
Neutro Abs: 2.6 10*3/uL (ref 1.5–6.5)
Platelets: 216 10*3/uL (ref 145–400)
RBC: 4.42 MIL/uL (ref 3.70–5.45)
RDW: 12.8 % (ref 11.2–14.5)
WBC: 4.6 10*3/uL (ref 3.9–10.3)

## 2017-06-11 NOTE — Telephone Encounter (Signed)
Gave patient avs and calendar of upcoming December appointments.  °

## 2017-06-15 ENCOUNTER — Telehealth: Payer: Self-pay | Admitting: *Deleted

## 2017-06-15 NOTE — Telephone Encounter (Signed)
Called and scheduled the patient to see Dr. Gerarda Fraction in November. She is a transfer of care from [facility]

## 2017-08-21 ENCOUNTER — Telehealth: Payer: Self-pay | Admitting: *Deleted

## 2017-08-21 NOTE — Telephone Encounter (Signed)
Called and left the patient a message to call the office back. Need to move the patient's appt from 11/15 to 11/13 at 9am

## 2017-09-09 ENCOUNTER — Telehealth: Payer: Self-pay

## 2017-09-09 NOTE — Telephone Encounter (Signed)
LVM for patient reminding of SCP visit with [place] on 09/15/17 at 2 pm.  Call back number to center left if pt has questions.

## 2017-09-15 ENCOUNTER — Encounter: Payer: Medicare Other | Admitting: Adult Health

## 2017-10-14 ENCOUNTER — Telehealth: Payer: Self-pay

## 2017-10-14 NOTE — Telephone Encounter (Signed)
LVM for pt reminding of SCP visit with NP on 10/23/17 at 11 am.

## 2017-10-23 ENCOUNTER — Encounter: Payer: Self-pay | Admitting: Adult Health

## 2017-10-23 ENCOUNTER — Inpatient Hospital Stay: Payer: Medicare Other | Attending: Adult Health | Admitting: Adult Health

## 2017-10-23 ENCOUNTER — Telehealth: Payer: Self-pay

## 2017-10-23 VITALS — BP 160/70 | HR 93 | Temp 98.3°F | Resp 16 | Ht 64.0 in | Wt 164.9 lb

## 2017-10-23 DIAGNOSIS — Z79810 Long term (current) use of selective estrogen receptor modulators (SERMs): Secondary | ICD-10-CM | POA: Insufficient documentation

## 2017-10-23 DIAGNOSIS — Z9011 Acquired absence of right breast and nipple: Secondary | ICD-10-CM

## 2017-10-23 DIAGNOSIS — Z90710 Acquired absence of both cervix and uterus: Secondary | ICD-10-CM | POA: Diagnosis not present

## 2017-10-23 DIAGNOSIS — F419 Anxiety disorder, unspecified: Secondary | ICD-10-CM | POA: Diagnosis not present

## 2017-10-23 DIAGNOSIS — C50811 Malignant neoplasm of overlapping sites of right female breast: Secondary | ICD-10-CM | POA: Insufficient documentation

## 2017-10-23 DIAGNOSIS — Z170 Estrogen receptor positive status [ER+]: Secondary | ICD-10-CM | POA: Diagnosis not present

## 2017-10-23 MED ORDER — VENLAFAXINE HCL ER 37.5 MG PO CP24
37.5000 mg | ORAL_CAPSULE | Freq: Every day | ORAL | 0 refills | Status: DC
Start: 2017-10-23 — End: 2017-11-24

## 2017-10-23 NOTE — Telephone Encounter (Signed)
lvm informing pt of scheduled mammogram

## 2017-10-23 NOTE — Progress Notes (Signed)
CLINIC:  Survivorship   REASON FOR VISIT:  Routine follow-up post-treatment for a recent history of breast cancer.  BRIEF ONCOLOGIC HISTORY:    Malignant neoplasm of overlapping sites of right breast in female, estrogen receptor positive ([place])   01/01/2017 Initial Biopsy    s/p Right breast overlapping sites biopsy x2 (different quadrants) on 01/01/2017 for a clinical mT2 N0,  Stage IB invasive lobular carcinoma, grade 2,  estrogen and progesterone receptor positive, HER-2 not amplified             (a) breast MRI on 01/16/2017 shows 2 additional areas of concern in the right breast    02/05/2017 Surgery    right mastectomy with sentinel lymph node sampling showed an mT2 N0, stage IB invasive lobular carcinoma, grade 2, with negative margins.  (Total of 4 lymph nodes removed    02/05/2017 Oncotype testing    The Oncotype DX score was 8, predicting a risk of outside the breast recurrence over the next 10 years of 3% if the patient's only systemic therapy is tamoxifen for 5 years.  It also predicts no significant benefit from chemotherapy.    02/27/2017 -  Anti-estrogen oral therapy    tamoxifen started 02/27/2017             (a) bone density at [facility] 12/12/2016 shows a T score of -3.2             (b) status post hysterectomy with bilateral salpingo-oophorectomy             (c) remote history of oral contraceptives with no complications    65/45/6132 Cancer Staging    Staging form: Breast, AJCC 8th Edition - Pathologic: Stage IA (pT2(m), pN0, cM0, G2, ER+, PR+, HER2-) - Signed by Gardenia Phlegm, NP on 10/21/2017     INTERVAL HISTORY:  Doris Maynard presents to the [facility] today for our initial meeting to review her survivorship care plan detailing her treatment course for breast cancer, as well as monitoring long-term side effects of that treatment, education regarding health maintenance, screening, and overall wellness and health promotion.     Overall,  Doris Maynard reports feeling quite well.  She is recovering well from her surgery and is taking Tamoxifen daily with minimal side effects.  She notes that since her diagnosis with breast cancer she has been anxious.  She has a fear of recurrence, and is struggling with this.  She is requesting that I prescribe an anti anxiety medication to help with this.  Doris Maynard is exercising regularly with silver sneakers and very much enjoys this.      REVIEW OF SYSTEMS:  Review of Systems  Constitutional: Negative for appetite change, chills, fatigue, fever and unexpected weight change.  HENT:   Negative for hearing loss, lump/mass and trouble swallowing.   Eyes: Negative for eye problems and icterus.  Respiratory: Negative for chest tightness, cough and shortness of breath.   Cardiovascular: Negative for chest pain, leg swelling and palpitations.  Gastrointestinal: Negative for abdominal distention, abdominal pain, constipation, diarrhea, nausea and vomiting.  Endocrine: Negative for hot flashes.  Skin: Negative for itching and rash.  Neurological: Negative for dizziness, extremity weakness, headaches and numbness.  Hematological: Negative for adenopathy. Does not bruise/bleed easily.  Psychiatric/Behavioral: Negative for depression and suicidal ideas. The patient is nervous/anxious.   Breast: Denies any new nodularity, masses, tenderness, nipple changes, or nipple discharge.      ONCOLOGY TREATMENT TEAM:  1. Surgeon:  Dr. Brantley Stage at  [facility] 2. Medical Oncologist: Dr. Jana Hakim      PAST MEDICAL/SURGICAL HISTORY:  Past Medical History:  Diagnosis Date  . Breast cancer, right breast ([place])    S/P mastectomy 02/05/2017  . Endometrial cancer ([place]) 2016    hysterectomy  . Hypertension   . Rosacea    mainly on face   Past Surgical History:  Procedure Laterality Date  . ABDOMINAL HYSTERECTOMY  04/2014  . BREAST BIOPSY  2007   core biopsy "think it was on the left breast"  .  BREAST BIOPSY Right 12/2016  . DILATION AND CURETTAGE OF UTERUS  1975   S/P miscarriage  . MASTECTOMY COMPLETE / SIMPLE W/ SENTINEL NODE BIOPSY Right 02/05/2017   RIGHT SIMPLE MASTECTOMY WITH AXILLARY SENTINEL NODE BIOPSY  . SIMPLE MASTECTOMY WITH AXILLARY SENTINEL NODE BIOPSY Right 02/05/2017   Procedure: RIGHT SIMPLE MASTECTOMY WITH AXILLARY SENTINEL NODE BIOPSY;  Surgeon: Erroll Luna, MD;  Location: [place];  Service: General;  Laterality: Right;     ALLERGIES:  Allergies  Allergen Reactions  . Clindamycin Diarrhea    ? C.DIFF. ?     CURRENT MEDICATIONS:  Outpatient Encounter Medications as of 10/23/2017  Medication Sig  . Cholecalciferol (VITAMIN D3) 5000 units CAPS Take 5,000 Units by mouth daily.  Marland Kitchen doxylamine, Sleep, (SLEEP AID) 25 MG tablet Take 25 mg by mouth at bedtime as needed for sleep.  Marland Kitchen lisinopril (PRINIVIL,ZESTRIL) 20 MG tablet Take 20 mg by mouth daily.  Vladimir Faster Glycol-Propyl Glycol (SYSTANE OP) Apply 1 drop to eye daily as needed (dry eyes).  . metroNIDAZOLE (METROCREAM) 0.75 % cream Apply 1 application topically daily as needed (rosacea).  . venlafaxine XR (EFFEXOR-XR) 37.5 MG 24 hr capsule Take 1 capsule (37.5 mg total) by mouth daily with breakfast.  . [DISCONTINUED] methocarbamol (ROBAXIN) 500 MG tablet Take 1 tablet (500 mg total) by mouth every 6 (six) hours as needed for muscle spasms. (Patient not taking: Reported on 03/06/2017)  . [DISCONTINUED] minocycline (DYNACIN) 100 MG tablet Take 100 mg by mouth 2 (two) times daily.   No facility-administered encounter medications on file as of 10/23/2017.      ONCOLOGIC FAMILY HISTORY:  History reviewed. No pertinent family history.   GENETIC COUNSELING/TESTING: Not at this time  SOCIAL HISTORY:  Social History   Socioeconomic History  . Marital status: Married    Spouse name: Not on file  . Number of children: Not on file  . Years of education: Not on file  . Highest education level: Not on file    Occupational History  . Not on file  Social Needs  . Financial resource strain: Not on file  . Food insecurity:    Worry: Not on file    Inability: Not on file  . Transportation needs:    Medical: Not on file    Non-medical: Not on file  Tobacco Use  . Smoking status: Never Smoker  . Smokeless tobacco: Never Used  Substance and Sexual Activity  . Alcohol use: Yes    Comment: 02/05/2017 will drink wine once or twice a month  . Drug use: No  . Sexual activity: Not Currently  Lifestyle  . Physical activity:    Days per week: Not on file    Minutes per session: Not on file  . Stress: Not on file  Relationships  . Social connections:    Talks on phone: Not on file    Gets together: Not on file    Attends religious service:  Not on file    Active member of club or organization: Not on file    Attends meetings of clubs or organizations: Not on file    Relationship status: Not on file  . Intimate partner violence:    Fear of current or ex partner: Not on file    Emotionally abused: Not on file    Physically abused: Not on file    Forced sexual activity: Not on file  Other Topics Concern  . Not on file  Social History Narrative  . Not on file      PHYSICAL EXAMINATION:  Vital Signs:   Vitals:   10/23/17 1048  BP: (!) 160/70  Pulse: 93  Resp: 16  Temp: 98.3 F (36.8 C)  SpO2: 100%   Filed Weights   10/23/17 1048  Weight: 164 lb 14.4 oz (74.8 kg)   General: Well-nourished, well-appearing female in no acute distress.  She is unaccompanied today.   HEENT: Head is normocephalic.  Pupils equal and reactive to light. Conjunctivae clear without exudate.  Sclerae anicteric. Oral mucosa is pink, moist.  Oropharynx is pink without lesions or erythema.  Lymph: No cervical, supraclavicular, or infraclavicular lymphadenopathy noted on palpation.  Cardiovascular: Regular rate and rhythm. Respiratory: Clear to auscultation bilaterally. Chest expansion symmetric; breathing  non-labored.  Breasts: right breast s/p mastectomy, no sign of local recurrence, left breast benign GI: Abdomen soft and round; non-tender, non-distended. Bowel sounds normoactive.  GU: Deferred.  Neuro: No focal deficits. Steady gait.  Psych: Mood and affect normal and appropriate for situation.  Extremities: No edema. MSK: No focal spinal tenderness to palpation.  Full range of motion in bilateral upper extremities Skin: Warm and dry.  LABORATORY DATA:  None for this visit.  DIAGNOSTIC IMAGING:  None for this visit.      ASSESSMENT AND PLAN:  Ms.. Maynard is a pleasant 72 y.o. female with Stage IA right breast invasive ductal carcinoma, ER+/PR+/HER2-, diagnosed in 12/2016, treated with mastectomy and anti-estrogen therapy with Tamoxifen beginning in 02/2017.  She presents to the Survivorship Clinic for our initial meeting and routine follow-up post-completion of treatment for breast cancer.    1. Stage IA right breast cancer:  Doris Maynard is continuing to recover from definitive treatment for breast cancer. She will follow-up with her medical oncologist, Dr. Jana Hakim in 12/2017 with history and physical exam per surveillance protocol.  She will continue her anti-estrogen therapy with Tamoxifen. Thus far, she is tolerating the Tamoxifen well, with minimal side effects. Today, a comprehensive survivorship care plan and treatment summary was reviewed with the patient today detailing her breast cancer diagnosis, treatment course, potential late/long-term effects of treatment, appropriate follow-up care with recommendations for the future, and patient education resources.  A copy of this summary, along with a letter will be sent to the patient's primary care provider via mail/fax/In Basket message after today's visit.    2.  Anxiety: We reviewed this in detail and I gave her information on support groups and offered her to meet with our counseling intern today.  She is very interested in  starting a medication.  I prescribed Venlafaxine 37.'5mg'$  today.  I reviewed risks/benefits.  She will have a 30 day supply and will call me in abut 3 weeks to tell me how she is doing on it.  She may very well need to increase the dose, and if that is the case, we can certainly do so when she calls.  She and I reviewed red flags with  the medication, and I also gave her detailed information about it in her AVS.    3. Bone health:  Given Doris Maynard's age/history of breast cancer, she is at risk for bone demineralization.  Her last DEXA s  In the meantime, she was encouraged to increase her consumption of foods rich in calcium, as well as increase her weight-bearing activities.  She was given education on specific activities to promote bone health.  4. Cancer screening:  Due to Doris Maynard's history and her age, she should receive screening for skin cancers, colon cancer, and gynecologic cancers.  The information and recommendations are listed on the patient's comprehensive care plan/treatment summary and were reviewed in detail with the patient.    5. Health maintenance and wellness promotion: Doris Maynard was encouraged to consume 5-7 servings of fruits and vegetables per day. We reviewed the "Nutrition Rainbow" handout, as well as the handout "Take Control of Your Health and Reduce Your Cancer Risk" from the [place].  She was also encouraged to engage in moderate to vigorous exercise for 30 minutes per day most days of the week. We discussed the [employer] fitness program, which is designed for cancer survivors to help them become more physically fit after cancer treatments.  She was instructed to limit her alcohol consumption and continue to abstain from tobacco use.     6. Support services/counseling: It is not uncommon for this period of the patient's cancer care trajectory to be one of many emotions and stressors.  We discussed an opportunity for her to participate in the next  session of [facility] ("Finding Your New Normal") support group series designed for patients after they have completed treatment.   Doris Maynard was encouraged to take advantage of our many other support services programs, support groups, and/or counseling in coping with her new life as a cancer survivor after completing anti-cancer treatment.  She was offered support today through active listening and expressive supportive counseling.  She was given information regarding our available services and encouraged to contact me with any questions or for help enrolling in any of our support group/programs.    Dispo:   -Return to cancer center in 12/2017 for f/u with Dr. Jana Hakim -Mammogram due in 12/2017 -Follow up with Dr. Shana Chute in 10/2018 -She is welcome to return back to the Survivorship Clinic at any time; no additional follow-up needed at this time.  -Consider referral back to survivorship as a long-term survivor for continued surveillance  A total of (30) minutes of face-to-face time was spent with this patient with greater than 50% of that time in counseling and care-coordination.   Gardenia Phlegm, NP Survivorship Program [place] [phone]   Note: PRIMARY CARE PROVIDER Kristopher Glee., [place] [place] [phone]

## 2017-10-23 NOTE — Patient Instructions (Signed)
Venlafaxine extended-release capsules  What is this medicine?  VENLAFAXINE(VEN la fax een) is used to treat depression, anxiety and panic disorder.  This medicine may be used for other purposes; ask your health care provider or pharmacist if you have questions.  COMMON BRAND NAME(S): Effexor XR  What should I tell my health care provider before I take this medicine?  They need to know if you have any of these conditions:  -bleeding disorders  -glaucoma  -heart disease  -high blood pressure  -high cholesterol  -kidney disease  -liver disease  -low levels of sodium in the blood  -mania or bipolar disorder  -seizures  -suicidal thoughts, plans, or attempt; a previous suicide attempt by you or a family  -take medicines that treat or prevent blood clots  -thyroid disease  -an unusual or allergic reaction to venlafaxine, desvenlafaxine, other medicines, foods, dyes, or preservatives  -pregnant or trying to get pregnant  -breast-feeding  How should I use this medicine?  Take this medicine by mouth with a full glass of water. Follow the directions on the prescription label. Do not cut, crush, or chew this medicine. Take it with food. If needed, the capsule may be carefully opened and the entire contents sprinkled on a spoonful of cool applesauce. Swallow the applesauce/pellet mixture right away without chewing and follow with a glass of water to ensure complete swallowing of the pellets. Try to take your medicine at about the same time each day. Do not take your medicine more often than directed. Do not stop taking this medicine suddenly except upon the advice of your doctor. Stopping this medicine too quickly may cause serious side effects or your condition may worsen.  A special MedGuide will be given to you by the pharmacist with each prescription and refill. Be sure to read this information carefully each time.  Talk to your pediatrician regarding the use of this medicine in children. Special care may be  needed.  Overdosage: If you think you have taken too much of this medicine contact a poison control center or emergency room at once.  NOTE: This medicine is only for you. Do not share this medicine with others.  What if I miss a dose?  If you miss a dose, take it as soon as you can. If it is almost time for your next dose, take only that dose. Do not take double or extra doses.  What may interact with this medicine?  Do not take this medicine with any of the following medications:  -certain medicines for fungal infections like fluconazole, itraconazole, ketoconazole, posaconazole, voriconazole  -cisapride  -desvenlafaxine  -dofetilide  -dronedarone  -duloxetine  -levomilnacipran  -linezolid  -MAOIs like Carbex, Eldepryl, Marplan, Nardil, and Parnate  -methylene blue (injected into a vein)  -milnacipran  -pimozide  -thioridazine  -ziprasidone  This medicine may also interact with the following medications:  -amphetamines  -aspirin and aspirin-like medicines  -certain medicines for depression, anxiety, or psychotic disturbances  -certain medicines for migraine headaches like almotriptan, eletriptan, frovatriptan, naratriptan, rizatriptan, sumatriptan, zolmitriptan  -certain medicines for sleep  -certain medicines that treat or prevent blood clots like dalteparin, enoxaparin, warfarin  -cimetidine  -clozapine  -diuretics  -fentanyl  -furazolidone  -indinavir  -isoniazid  -lithium  -metoprolol  -NSAIDS, medicines for pain and inflammation, like ibuprofen or naproxen  -other medicines that prolong the QT interval (cause an abnormal heart rhythm)  -procarbazine  -rasagiline  -supplements like St. John's wort, kava kava, valerian  -tramadol  -tryptophan    worse. Visit your doctor or health care professional for regular checks on your progress. Because it may take several weeks to see the full effects of this medicine, it is important to continue your treatment as prescribed by your doctor. Patients and their families should watch out for new or worsening thoughts of suicide or depression. Also watch out for sudden changes in feelings such as feeling anxious, agitated, panicky, irritable, hostile, aggressive, impulsive, severely restless, overly excited and hyperactive, or not being able to sleep. If this happens, especially at the beginning of treatment or after a change in dose, call your health care professional. This medicine can cause an increase in blood pressure. Check with your doctor for instructions on monitoring your blood pressure while taking this medicine. You may get drowsy or dizzy. Do not drive, use machinery, or do anything that needs mental alertness until you know how this medicine affects you. Do not stand or sit up quickly, especially if you are an older patient. This reduces the risk of dizzy or fainting spells. Alcohol may interfere with the effect of this medicine. Avoid alcoholic drinks. Your mouth may get dry. Chewing sugarless gum, sucking hard candy and drinking plenty of water will help. Contact your doctor if the problem does not go away or is severe. What side effects may I notice from receiving this medicine? Side effects that you should report to your doctor or health care professional as soon as possible: -allergic reactions like skin rash, itching or hives, swelling of the face, lips, or tongue -anxious -breathing problems -confusion -changes in vision -chest pain -confusion -elevated mood, decreased need for sleep, racing thoughts, impulsive behavior -eye pain -fast, irregular  heartbeat -feeling faint or lightheaded, falls -feeling agitated, angry, or irritable -hallucination, loss of contact with reality -high blood pressure -loss of balance or coordination -palpitations -redness, blistering, peeling or loosening of the skin, including inside the mouth -restlessness, pacing, inability to keep still -seizures -stiff muscles -suicidal thoughts or other mood changes -trouble passing urine or change in the amount of urine -trouble sleeping -unusual bleeding or bruising -unusually weak or tired -vomiting Side effects that usually do not require medical attention (report to your doctor or health care professional if they continue or are bothersome): -change in sex drive or performance -change in appetite or weight -constipation -dizziness -dry mouth -headache -increased sweating -nausea -tired This list may not describe all possible side effects. Call your doctor for medical advice about side effects. You may report side effects to FDA at 1-800-FDA-1088. Where should I keep my medicine? Keep out of the reach of children. Store at a controlled temperature between 20 and 25 degrees C (68 degrees and 77 degrees F), in a dry place. Throw away any unused medicine after the expiration date. NOTE: This sheet is a summary. It may not cover all possible information. If you have questions about this medicine, talk to your doctor, pharmacist, or health care provider.  2018 Elsevier/Gold Standard (2015-05-24 18:38:02)  

## 2017-11-18 ENCOUNTER — Inpatient Hospital Stay: Payer: Medicare Other | Attending: Adult Health | Admitting: Obstetrics

## 2017-11-18 ENCOUNTER — Encounter: Payer: Self-pay | Admitting: Obstetrics

## 2017-11-18 VITALS — BP 141/71 | HR 100 | Temp 98.1°F | Resp 20 | Ht 64.0 in | Wt 161.3 lb

## 2017-11-18 DIAGNOSIS — Z90722 Acquired absence of ovaries, bilateral: Secondary | ICD-10-CM | POA: Diagnosis not present

## 2017-11-18 DIAGNOSIS — Z79810 Long term (current) use of selective estrogen receptor modulators (SERMs): Secondary | ICD-10-CM

## 2017-11-18 DIAGNOSIS — Z170 Estrogen receptor positive status [ER+]: Secondary | ICD-10-CM | POA: Diagnosis not present

## 2017-11-18 DIAGNOSIS — Z90710 Acquired absence of both cervix and uterus: Secondary | ICD-10-CM | POA: Diagnosis not present

## 2017-11-18 DIAGNOSIS — C50811 Malignant neoplasm of overlapping sites of right female breast: Secondary | ICD-10-CM | POA: Diagnosis not present

## 2017-11-18 DIAGNOSIS — C541 Malignant neoplasm of endometrium: Secondary | ICD-10-CM | POA: Diagnosis present

## 2017-11-18 NOTE — Progress Notes (Signed)
[place] [facility] Note: New Patient FIRST VISIT   Consult was requested by Dr. Lurline Del for a history of uterine cancer desiring transfer of care closer to home   Chief Complaint  Patient presents with  . Endometrial cancer [facility])    GYN Oncologic Summary 1. Stage IB, Grade 1 Endometrioid Endometrial Adenocarcinoma (Myo invasion =50% 0.7/1.4cm, neg LVSI) o 04/2014 - RA TLH, BSO, PLND 2. ? MMR   HPI: [name]  is a nice 71 y.o.  P2  Recent diagnosis of breast cancer and now being followed now by Dr. Jana Hakim. Would like to move all her care closer to home, here at [place].  Her uterine cancer oncologic course started with postmenopausal bleeding which was evaluated with a D&C by her gynecologist.  She was then referred on to [facility] for surgery.  Dr. Wille Celeste did take the patient for a RA TLH/BSO and PLND 04/13/2014 .  Final pathology revealed a grade 1 stage Ib endometrial cancer the pathology report describing a well-differentiated endometrioid adenocarcinoma extending to 50% of the myometrium, but with unremarkable cervix, bilateral adnexa, and 6 right and 8 left pelvic lymph nodes.  Depth of invasion was 0.7 out of 1.4 cm there was no lymphovascular space invasion.  The tumor size was 2.3 cm, endometrioid and grade 1  She did not receive any adjuvant radiation therapy.  She denies having any genetic counseling but does have some tumor profile testing for her breast cancer history.  I do not see that mismatch repair testing was done on her original hysterectomy specimen from 2016.  Currently she is taking tamoxifen for her breast cancer history. She has no pelvic pain no bleeding.   Imported EPIC Oncologic History:    Malignant neoplasm of overlapping sites of right breast in female, estrogen receptor positive ([place])   01/01/2017 Initial Biopsy    s/p Right breast overlapping sites biopsy x2 (different  quadrants) on 01/01/2017 for a clinical mT2 N0,  Stage IB invasive lobular carcinoma, grade 2,  estrogen and progesterone receptor positive, HER-2 not amplified             (a) breast MRI on 01/16/2017 shows 2 additional areas of concern in the right breast    02/05/2017 Surgery    right mastectomy with sentinel lymph node sampling showed an mT2 N0, stage IB invasive lobular carcinoma, grade 2, with negative margins.  (Total of 4 lymph nodes removed    02/05/2017 Oncotype testing    The Oncotype DX score was 8, predicting a risk of outside the breast recurrence over the next 10 years of 3% if the patient's only systemic therapy is tamoxifen for 5 years.  It also predicts no significant benefit from chemotherapy.    02/27/2017 -  Anti-estrogen oral therapy    tamoxifen started 02/27/2017             (a) bone density at [facility] 12/12/2016 shows a T score of -3.2             (b) status post hysterectomy with bilateral salpingo-oophorectomy             (c) remote history of oral contraceptives with no complications    62/69/4854 Cancer Staging    Staging form: Breast, AJCC 8th Edition - Pathologic: Stage IA (pT2(m), pN0, cM0, G2, ER+, PR+, HER2-) - Signed by Gardenia Phlegm, NP on 10/21/2017     Measurement of  disease: Will be clinical exam and annual Pap every Nov  Radiology: No results found. .  .   Outpatient Encounter Medications as of 11/18/2017  Medication Sig  . Cholecalciferol (VITAMIN D3) 5000 units CAPS Take 5,000 Units by mouth daily.  Marland Kitchen doxylamine, Sleep, (SLEEP AID) 25 MG tablet Take 25 mg by mouth at bedtime as needed for sleep.  Marland Kitchen lisinopril (PRINIVIL,ZESTRIL) 20 MG tablet Take 20 mg by mouth daily.  . metroNIDAZOLE (METROCREAM) 0.75 % cream Apply 1 application topically daily as needed (rosacea). Patient applies to both eyes as needed  . Polyethyl Glycol-Propyl Glycol (SYSTANE OP) Apply 1 drop to eye daily as needed (dry eyes).  . venlafaxine XR (EFFEXOR-XR)  37.5 MG 24 hr capsule Take 1 capsule (37.5 mg total) by mouth daily with breakfast.   No facility-administered encounter medications on file as of 11/18/2017.    Allergies  Allergen Reactions  . Clindamycin Diarrhea    ? C.DIFF. ?    Past Medical History:  Diagnosis Date  . Breast cancer, right breast ([place])    S/P mastectomy 02/05/2017. Tamoxifen 2019 - ____  . Endometrial cancer ([place]) 2016    hysterectomy  . Hypertension   . Rosacea    mainly on face   Past Surgical History:  Procedure Laterality Date  . BREAST BIOPSY  2007   core biopsy "think it was on the left breast"  . BREAST BIOPSY Right 12/2016  . DILATION AND CURETTAGE OF UTERUS  1975   S/P miscarriage  . MASTECTOMY COMPLETE / SIMPLE W/ SENTINEL NODE BIOPSY Right 02/05/2017   RIGHT SIMPLE MASTECTOMY WITH AXILLARY SENTINEL NODE BIOPSY  . ROBOTIC ASSISTED TOTAL HYSTERECTOMY WITH BILATERAL SALPINGO OOPHERECTOMY  04/2014   and pelvic lymph node dissection - [facility]  . SIMPLE MASTECTOMY WITH AXILLARY SENTINEL NODE BIOPSY Right 02/05/2017   Procedure: RIGHT SIMPLE MASTECTOMY WITH AXILLARY SENTINEL NODE BIOPSY;  Surgeon: Erroll Luna, MD;  Location: [place];  Service: General;  Laterality: Right;        Past Gynecological History:   GYNECOLOGIC HISTORY:  . No LMP recorded. Patient has had a hysterectomy. late 75's . Menarche: 72 years old . P 2 . Contraceptive OCP . HRT none  . Last Pap NA Family Hx:  Family History  Problem Relation Age of Onset  . Colon cancer Father 24   Social Hx:  Marland Kitchen Tobacco use: none . Alcohol use: 1-2 / month . Illicit Drug use: none . Illicit IV Drug use: none    Review of Systems: Review of Systems  Constitutional: Negative.   HENT:  Negative.   Eyes: Negative.   Respiratory: Negative.   Cardiovascular: Negative.   Gastrointestinal: Negative.   Endocrine: Negative.   Genitourinary: Negative.    Musculoskeletal: Negative.   Skin: Negative.   Neurological: Negative.    Hematological: Negative.   Psychiatric/Behavioral: Negative.     Vitals:  Vitals:   11/18/17 0959  BP: (!) 141/71  Pulse: 100  Resp: 20  Temp: 98.1 F (36.7 C)  SpO2: 98%   Vitals:   11/18/17 0959  Weight: 161 lb 4.8 oz (73.2 kg)  Height: 5' 4"  (1.626 m)   Body mass index is 27.69 kg/m.  Physical Exam: General :  Well developed, 72 y.o., female in no apparent distress HEENT:  Normocephalic/atraumatic, symmetric, EOMI, eyelids normal Neck:   Supple, no masses.  Lymphatics:  No cervical/ submandibular/ supraclavicular/ infraclavicular/ inguinal adenopathy Respiratory:  Respirations unlabored, no use of accessory muscles CV:  Deferred Breast:  Deferred Musculoskeletal: No CVA tenderness, normal muscle strength. Abdomen:  Trocar sites soft.  Soft, non-tender and nondistended. No evidence of hernia. No masses. Extremities:  No lymphedema, no erythema, non-tender. Skin:   Normal inspection Neuro/Psych:  No focal motor deficit, no abnormal mental status. Normal gait. Normal affect. Alert and oriented to person, place, and time  Genito Urinary: Vulva: Normal external female genitalia.  Bladder/urethra: Urethral meatus normal in size and location. No lesions or   masses, well supported bladder Speculum exam: Vagina: No lesion, no discharge, no bleeding. Bimanual exam: Cervix/Uterus/Adnexa: Surgically absent  Adnexal region: No masses. Rectovaginal:  Good tone, no masses, no cul de sac nodularity, no parametrial involvement or nodularity.   Assessment  Endometrial CA ECOG PERFORMANCE STATUS: 0 - Asymptomatic  Plan   1. Surveillance plan ? Followup for pelvic Q6 months until 04/2019 then see about dispo ? Annual pap, discussed no clinical evidence however highest risk recurrence at vaginal cuff given she did not have RT 2. Breast cancer per Dr. Jana Hakim ? We discussed that the Tamoxifen, while possibly leading to uterine cancer, I feel that the benefit for her breast  cancer outcome outweighs the risk of recurrence of her uterine cacner 3. Genetic counseling o I encouraged her to consider genetic testing given her father had colon cancer, albeit in his 75's. o We don't have an MMR on the tissue to reassure ourselves o Medicare should cover this cost o We discussed the GINA act   Face to face time with patient was 60 minutes. Over 50% of this time was spent on counseling and coordination of care.   Mart Piggs, MD Gynecologic Oncologist 11/18/2017, 6:22 PM    Cc: Lurline Del, MD (Referring Med Onc) Kristopher Glee., MD (PCP)

## 2017-11-18 NOTE — Patient Instructions (Signed)
Return in 6 months for a pelvic

## 2017-11-20 ENCOUNTER — Ambulatory Visit: Payer: Medicare Other | Admitting: Obstetrics

## 2017-11-23 LAB — CYTOLOGY - PAP: DIAGNOSIS: NEGATIVE

## 2017-11-24 ENCOUNTER — Telehealth: Payer: Self-pay | Admitting: *Deleted

## 2017-11-24 ENCOUNTER — Other Ambulatory Visit: Payer: Self-pay | Admitting: Adult Health

## 2017-11-24 ENCOUNTER — Telehealth: Payer: Self-pay | Admitting: Adult Health

## 2017-11-24 DIAGNOSIS — C50811 Malignant neoplasm of overlapping sites of right female breast: Secondary | ICD-10-CM

## 2017-11-24 DIAGNOSIS — Z170 Estrogen receptor positive status [ER+]: Principal | ICD-10-CM

## 2017-11-24 MED ORDER — VENLAFAXINE HCL ER 37.5 MG PO CP24: 37.5000 mg | ORAL_CAPSULE | Freq: Every day | ORAL | 0 refills | Status: DC

## 2017-11-24 NOTE — Telephone Encounter (Signed)
Venlafaxine ER 90 day refill sent to [facility] per NP ok.  Patient called to say that venlafaxine has helped with her anxiety.

## 2017-11-24 NOTE — Telephone Encounter (Signed)
Notified of normal pap smear

## 2017-12-14 ENCOUNTER — Ambulatory Visit: Payer: Medicare Other

## 2017-12-14 ENCOUNTER — Ambulatory Visit
Admission: RE | Admit: 2017-12-14 | Discharge: 2017-12-14 | Disposition: A | Discharge status: Home / Self Care | Payer: Medicare Other | Source: Ambulatory Visit | Attending: Oncology | Admitting: Oncology

## 2017-12-14 DIAGNOSIS — Z170 Estrogen receptor positive status [ER+]: Principal | ICD-10-CM

## 2017-12-14 DIAGNOSIS — C50811 Malignant neoplasm of overlapping sites of right female breast: Secondary | ICD-10-CM

## 2017-12-16 NOTE — Progress Notes (Signed)
[place]  [phone] Fax:([phone]) [phone]     ID: Doris Maynard DOB: [birth year]  MR#: [id number]  GYK#:[id number]  Patient Care Team: Kristopher Glee., MD as PCP - General (Internal Medicine) Erroll Luna, MD as Consulting Physician (General Surgery) Jandiel Magallanes, Virgie Dad, MD as Consulting Physician (Oncology) Maralyn Sago, MD as Referring Physician (Oncology) Delice Bison, Charlestine Massed, NP as Nurse Practitioner (Hematology and Oncology) Isabel Caprice, MD as Consulting Physician (Gynecologic Oncology) OTHER MD:   CHIEF COMPLAINT: estrogen receptor positive breast cancer; endometrial cancer  CURRENT TREATMENT: Tamoxifen   HISTORY OF CURRENT ILLNESS: From the original intake note:  Doris Maynard had routine screening mammography in [facility] on 12/12/2016 showing a possible abnormality in the right breast. She underwent unilateral right diagnostic mammography with tomography and right breast ultrasonography in [facility] on 12/19/2016 showing: breast density category C. Suspicious irregular hypoechoic mass right breast 10 o'clock upper outer position 6 cm from the nipple measuring 1.2 x 0.6 x 0.7 cm, felt to correspond with focal area of distortion on mammography. Additional suspicious right breast mass 1 o'clock upper inner position 4 cm from the nipple measuring 0.9 x 0.7 x 0.7 cm. Multiple cortically thickened right axillary lymph nodes.   Accordingly on 01/01/2017 she proceeded to biopsy of the right breast masses along with lymph node sampling . The pathology from this procedure showed ([id number])  Right breast 10 o'clock position: Invasive mammary carcinoma grade II. At the 1 o'clock position: Invasive mammary carcinoma grade II. E-cadherin stain shows diffuse but weak reactivity amongst the cells, favoring lobular carcinoma. Right axillary lymph node: No evidence of malignancy. Estrogen receptor positive at 99%, progesterone receptor  positive at 99%, both with strong positivity, HER-2 not amplified by immunohistochemistry (1+).  She also completed a bilateral breast MRI on 01/16/2017 showing: Biopsy-proven multicentric invasive carcinomas within the RIGHT breast at the 10 o'clock and 1 o'clock axes, measuring 3.3 cm and 2 cm respectively, with associated biopsy clip artifacts. Additional suspicious enhancing mass within the slightly inner RIGHT breast, at middle depth, measuring 5 mm, located approximately 1.5 cm medial to the biopsy-proven cancer at the 10 o'clock axis, suspected satellite lesion. Additional suspicious enhancing mass within the upper inner quadrant of the RIGHT breast, at anterior depth, 2-3 o'clock axis, measuring 7 mm. No evidence of malignancy within the LEFT breast.  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  The patient's subsequent history is as detailed below.   INTERVAL HISTORY: Doris Maynard returns today for follow-up of her estrogen receptor positive breast cancer.  The patient continues on tamoxifen, which she is tolerating well.  She denies problems with hot flashes, vaginal wetness, or any other issues related to that drug  Since her last visit here, she underwent a digital screening unilateral left mammogram with tomography on 12/14/2017 showing: Breast Density Category C. There is no mammographic evidence of malignancy.  Recall she also has a history of endometrial cancer.  She  underwent a CervicoVaginal Pap pathology screening under Dr. Gerarda Fraction [phone]) on 11/18/2017 showing negative for intraepithelial lesions or malignancy.  REVIEW OF SYSTEMS: Doris Maynard is doing well overall. For exercise, she has been going to [employer] twice a week, and walks on her own occationally. She has also been going to the [facility] once a week for Yoga. She has made positive changes to her diet to lose weight. The patient denies unusual headaches, visual changes, nausea, vomiting, or dizziness. There has been no unusual cough, phlegm production, or pleurisy. This been no change in bowel or bladder habits. The patient denies unexplained fatigue or unexplained weight loss, bleeding, rash, or fever. A detailed review of systems was otherwise noncontributory.    PAST MEDICAL HISTORY: Past Medical History:  Diagnosis Date  . Breast cancer, right breast ([place])    S/P mastectomy 02/05/2017. Tamoxifen 2019 - ____  . Endometrial cancer ([place]) 2016    hysterectomy  . Hypertension   . Rosacea    mainly on face  Hysterectomy with BSO with Dr. Claiborne Billings.   PAST SURGICAL HISTORY: Past Surgical History:  Procedure Laterality Date  . BREAST BIOPSY  2007   core biopsy "think it was on the left breast"  . BREAST BIOPSY Right 12/2016  . DILATION AND CURETTAGE OF UTERUS  1975   S/P miscarriage  . MASTECTOMY COMPLETE / SIMPLE W/ SENTINEL NODE BIOPSY Right 02/05/2017   RIGHT SIMPLE MASTECTOMY WITH AXILLARY SENTINEL NODE BIOPSY  . ROBOTIC ASSISTED TOTAL HYSTERECTOMY WITH BILATERAL SALPINGO OOPHERECTOMY  04/2014   and pelvic lymph node dissection - [facility]  . SIMPLE MASTECTOMY WITH AXILLARY SENTINEL NODE BIOPSY Right 02/05/2017   Procedure: RIGHT SIMPLE MASTECTOMY WITH AXILLARY SENTINEL NODE BIOPSY;  Surgeon: Erroll Luna, MD;  Location: [place];  Service: General;  Laterality: Right;    FAMILY HISTORY Family History  Problem Relation Age of Onset  . Colon cancer Father 39    The patient's  father died of colon cancer at age 76. Her mother died at age 29 with a neck condition. The patient had 1 brother who died at age 90 with  Dementia, and she has no sisters. She denies a family history of ovarian or breast cancer.   GYNECOLOGIC HISTORY:  No LMP recorded. Patient has had a hysterectomy. 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 Her husband, Doris Maynard, is retired from the [employer]. She has 2 children, her oldest is daughter, Doris Maynard who is 73 and a Freight forwarder for [employer] in [place]. Allecia's youngest son, Doris Maynard age 49  is an attorney in [place], [place]. The patient has 2 grandchildren who both live in [place]. She goes to a [employer] in [place], [place]     ADVANCED DIRECTIVES:   HEALTH MAINTENANCE: Social History   Tobacco Use  . Smoking status: Never Smoker  . Smokeless tobacco: Never Used  Substance Use Topics  . Alcohol use: Yes    Comment: 02/05/2017 will drink wine once or twice a month  . Drug use: No     Colonoscopy: 10/30/2014  PAP: status post hysterectomy  Bone density:12/12/2016 T score -3.2 osteoporosis   Allergies  Allergen Reactions  . Clindamycin Diarrhea    ? C.DIFF. ?    Current Outpatient Medications  Medication Sig Dispense Refill  . Cholecalciferol (VITAMIN D3) 5000 units CAPS Take 5,000 Units by mouth daily.    Marland Kitchen  doxylamine, Sleep, (SLEEP AID) 25 MG tablet Take 25 mg by mouth at bedtime as needed for sleep.    . lisinopril (PRINIVIL,ZESTRIL) 20 MG tablet Take 20 mg by mouth daily.    . metroNIDAZOLE (METROCREAM) 0.75 % cream Apply 1 application topically daily as needed (rosacea). Patient applies to both eyes as needed    . Polyethyl Glycol-Propyl Glycol (SYSTANE OP) Apply 1 drop to eye daily as needed (dry eyes).     . venlafaxine XR (EFFEXOR-XR) 37.5 MG 24 hr capsule Take 1 capsule (37.5 mg total) by mouth daily with breakfast. 90 capsule 0   No current facility-administered medications for this visit.     OBJECTIVE: Middle-aged white woman in no acute distress  Vitals:   12/17/17 0948  BP: 134/68  Pulse: (!) 110  Resp: 18  Temp: 97.8 F (36.6 C)  SpO2: 100%     Body mass index is 27.69 kg/m.   Wt Readings from Last 3 Encounters:  12/17/17 161 lb 4.8 oz (73.2 kg)  11/18/17 161 lb 4.8 oz (73.2 kg)  10/23/17 164 lb 14.4 oz (74.8 kg)      ECOG FS:0 - Asymptomatic  Mild rosacea Sclerae unicteric, EOMs intact No cervical or supraclavicular adenopathy Lungs no rales or rhonchi Heart regular rate and rhythm Abd soft, nontender, positive bowel sounds MSK no focal spinal tenderness, no upper extremity lymphedema Neuro: nonfocal, well oriented, appropriate affect Breasts: Right breast is status post mastectomy.  There is no evidence of disease recurrence.  Left breast is benign.  Both axillae are benign   LAB RESULTS:  CMP     Component Value Date/Time   NA 143 12/17/2017 0903   K 4.3 12/17/2017 0903   CL 108 12/17/2017 0903   CO2 26 12/17/2017 0903   GLUCOSE 101 (H) 12/17/2017 0903   BUN 14 12/17/2017 0903   CREATININE 0.91 12/17/2017 0903   CALCIUM 9.7 12/17/2017 0903   PROT 7.0 12/17/2017 0903   ALBUMIN 4.0 12/17/2017 0903   AST 20 12/17/2017 0903   ALT 16 12/17/2017 0903   ALKPHOS 55 12/17/2017 0903   BILITOT 0.6 12/17/2017 0903   GFRNONAA >60 12/17/2017 0903   GFRAA >60 12/17/2017 0903    No results found for: TOTALPROTELP, ALBUMINELP, A1GS, A2GS, BETS, BETA2SER, GAMS, MSPIKE, SPEI  No results found for: KPAFRELGTCHN, LAMBDASER, KAPLAMBRATIO  Lab Results  Component Value Date   WBC 5.2 12/17/2017   NEUTROABS 3.0 12/17/2017   HGB 13.4 12/17/2017   HCT 41.8 12/17/2017   MCV 92.9 12/17/2017   PLT 223 12/17/2017    @LASTCHEMISTRY@  No results found for:  LABCA2  No components found for: LABCAN125  No results for input(s): INR in the last 168 hours.  No results found for: LABCA2  No results found for: CAN199  No results found for: CAN125  No results found for: CAN153  No results found for: CA2729  No components found for: HGQUANT  No results found for: CEA1 / No results found for: CEA1   No results found for: AFPTUMOR  No results found for: CHROMOGRNA  No results found for: PSA1  Appointment on 12/17/2017  Component Date Value Ref Range Status  . Sodium 12/17/2017 143  135 - 145 mmol/L Final  . Potassium 12/17/2017 4.3  3.5 - 5.1 mmol/L Final  . Chloride 12/17/2017 108  98 - 111 mmol/L Final  . CO2 12/17/2017 26  22 - 32 mmol/L Final  . Glucose, Bld 12/17/2017 101* 70 - 99 mg/dL Final  .   BUN 12/17/2017 14  8 - 23 mg/dL Final  . Creatinine, Ser 12/17/2017 0.91  0.44 - 1.00 mg/dL Final  . Calcium 12/17/2017 9.7  8.9 - 10.3 mg/dL Final  . Total Protein 12/17/2017 7.0  6.5 - 8.1 g/dL Final  . Albumin 12/17/2017 4.0  3.5 - 5.0 g/dL Final  . AST 12/17/2017 20  15 - 41 U/L Final  . ALT 12/17/2017 16  0 - 44 U/L Final  . Alkaline Phosphatase 12/17/2017 55  38 - 126 U/L Final  . Total Bilirubin 12/17/2017 0.6  0.3 - 1.2 mg/dL Final  . GFR calc non Af Amer 12/17/2017 >60  >60 mL/min Final  . GFR calc Af Amer 12/17/2017 >60  >60 mL/min Final  . Anion gap 12/17/2017 9  5 - 15 Final   Performed at [facility], [address] [place], [place] [address]  . WBC 12/17/2017 5.2  4.0 - 10.5 K/uL Final  . RBC 12/17/2017 4.50  3.87 - 5.11 MIL/uL Final  . Hemoglobin 12/17/2017 13.4  12.0 - 15.0 g/dL Final  . HCT 12/17/2017 41.8  36.0 - 46.0 % Final  . MCV 12/17/2017 92.9  80.0 - 100.0 fL Final  . MCH 12/17/2017 29.8  26.0 - 34.0 pg Final  . MCHC 12/17/2017 32.1  30.0 - 36.0 g/dL Final  . RDW 12/17/2017 11.8  11.5 - 15.5 % Final  . Platelets 12/17/2017 223  150 - 400 K/uL Final  . nRBC 12/17/2017 0.0  0.0 -  0.2 % Final  . Neutrophils Relative % 12/17/2017 59  % Final  . Neutro Abs 12/17/2017 3.0  1.7 - 7.7 K/uL Final  . Lymphocytes Relative 12/17/2017 29  % Final  . Lymphs Abs 12/17/2017 1.5  0.7 - 4.0 K/uL Final  . Monocytes Relative 12/17/2017 8  % Final  . Monocytes Absolute 12/17/2017 0.4  0.1 - 1.0 K/uL Final  . Eosinophils Relative 12/17/2017 3  % Final  . Eosinophils Absolute 12/17/2017 0.2  0.0 - 0.5 K/uL Final  . Basophils Relative 12/17/2017 1  % Final  . Basophils Absolute 12/17/2017 0.1  0.0 - 0.1 K/uL Final  . Immature Granulocytes 12/17/2017 0  % Final  . Abs Immature Granulocytes 12/17/2017 0.01  0.00 - 0.07 K/uL Final   Performed at  [facility], [address] [place], [place] [address]    (this displays the last labs from the last 3 days)  No results found for: TOTALPROTELP, ALBUMINELP, A1GS, A2GS, BETS, BETA2SER, GAMS, MSPIKE, SPEI (this displays SPEP labs)  No results found for: KPAFRELGTCHN, LAMBDASER, KAPLAMBRATIO (kappa/lambda light chains)  No results found for: HGBA, HGBA2QUANT, HGBFQUANT, HGBSQUAN (Hemoglobinopathy evaluation)   No results found for: LDH  No results found for: IRON, TIBC, IRONPCTSAT (Iron and TIBC)  No results found for: FERRITIN  Urinalysis No results found for: COLORURINE, APPEARANCEUR, LABSPEC, PHURINE, GLUCOSEU, HGBUR, BILIRUBINUR, KETONESUR, PROTEINUR, UROBILINOGEN, NITRITE, LEUKOCYTESUR   STUDIES: Mm 3d Screen Breast Uni Left  Result Date: 12/14/2017 CLINICAL DATA:  Screening. EXAM: DIGITAL SCREENING UNILATERAL LEFT MAMMOGRAM WITH CAD AND TOMO COMPARISON:  Previous exam(s). ACR Breast Density Category c: The breast tissue is heterogeneously dense, which may obscure small masses. FINDINGS: The patient has had a right mastectomy. There are no findings suspicious for malignancy. Images were processed with CAD. IMPRESSION: No mammographic evidence of malignancy. A result letter of this screening mammogram will  be mailed directly to the patient. RECOMMENDATION: Screening mammogram in one year.  (Code:SM-L-12M) BI-RADS   CATEGORY  1: Negative. Electronically Signed   By: Ammie Ferrier M.D.   On: 12/14/2017 16:38    ELIGIBLE FOR AVAILABLE RESEARCH PROTOCOL: no  ASSESSMENT: 72 y.o. [place], [place] Woman status post robot assisted abdominal hysterectomy bilateral salpingo-oophorectomy and bilateral pelvic lymph node dissection 04/13/2014 for a grade 1, stage Ib endometrial cancer; now with a history of breast cancer as follows:  (1) s/p Right breast overlapping sites biopsy x2 (different quadrants) on 01/01/2017 for a clinical mT2 N0,  Stage IB invasive lobular carcinoma, grade 2,  estrogen and progesterone receptor positive, HER-2 not amplified  (a) breast MRI on 01/16/2017 shows 2 additional areas of concern in the right breast  (2) right mastectomy with sentinel lymph node sampling 02/05/2017 showed an mT2 N0, stage IB invasive lobular carcinoma, grade 2, with negative margins.  (Total of 4 lymph nodes removed  (3) The Oncotype DX score was 8, predicting a risk of outside the breast recurrence over the next 10 years of 3% if the patient's only systemic therapy is tamoxifen for 5 years.  It also predicts no significant benefit from chemotherapy.  (4) adjuvant radiation not indicated  (5) tamoxifen started 02/27/2017  (a) bone density at [facility] 12/12/2016 shows a T score of -3.2  (b) status post hysterectomy with bilateral salpingo-oophorectomy  (c) remote history of oral contraceptives with no complications   PLAN: Doris Maynard is now just about a year out from definitive surgery for her breast cancer with no evidence of disease recurrence.  This is favorable.  She is tolerating tamoxifen well and the plan will be to continue that a minimum of 5 years.  She has an excellent exercise program and is working hard on her diet.  This is also very favorable  She remains quite anxious about the breast  cancer issue.  This is of course very normal and common at this stage.  I think it would be helpful if she participated in our finding your new normal group and I gave her the information regarding that  Otherwise she will have mammography again in a year and she will see me shortly after that test  She knows to call for any other problems that may develop before then.    Irelynn Schermerhorn, Virgie Dad, MD  12/17/17 10:02 AM Medical Oncology and Hematology [facility] [address] [place], [place] [address] Tel. [phone]    Fax. [phone]   I, Jacqualyn Posey am acting as a Education administrator for Chauncey Cruel, MD.   I, Lurline Del MD, have reviewed the above documentation for accuracy and completeness, and I agree with the above.

## 2017-12-17 ENCOUNTER — Telehealth: Payer: Self-pay | Admitting: Oncology

## 2017-12-17 ENCOUNTER — Inpatient Hospital Stay: Payer: Medicare Other | Attending: Adult Health

## 2017-12-17 ENCOUNTER — Inpatient Hospital Stay: Payer: Medicare Other | Admitting: Oncology

## 2017-12-17 VITALS — BP 134/68 | HR 110 | Temp 97.8°F | Resp 18 | Ht 64.0 in | Wt 161.3 lb

## 2017-12-17 DIAGNOSIS — Z170 Estrogen receptor positive status [ER+]: Secondary | ICD-10-CM | POA: Diagnosis not present

## 2017-12-17 DIAGNOSIS — I10 Essential (primary) hypertension: Secondary | ICD-10-CM

## 2017-12-17 DIAGNOSIS — Z8542 Personal history of malignant neoplasm of other parts of uterus: Secondary | ICD-10-CM | POA: Insufficient documentation

## 2017-12-17 DIAGNOSIS — Z9079 Acquired absence of other genital organ(s): Secondary | ICD-10-CM | POA: Insufficient documentation

## 2017-12-17 DIAGNOSIS — Z90710 Acquired absence of both cervix and uterus: Secondary | ICD-10-CM

## 2017-12-17 DIAGNOSIS — C50211 Malignant neoplasm of upper-inner quadrant of right female breast: Secondary | ICD-10-CM | POA: Insufficient documentation

## 2017-12-17 DIAGNOSIS — C50811 Malignant neoplasm of overlapping sites of right female breast: Secondary | ICD-10-CM

## 2017-12-17 DIAGNOSIS — Z90722 Acquired absence of ovaries, bilateral: Secondary | ICD-10-CM | POA: Insufficient documentation

## 2017-12-17 DIAGNOSIS — Z9011 Acquired absence of right breast and nipple: Secondary | ICD-10-CM | POA: Diagnosis not present

## 2017-12-17 DIAGNOSIS — Z79810 Long term (current) use of selective estrogen receptor modulators (SERMs): Secondary | ICD-10-CM | POA: Diagnosis not present

## 2017-12-17 DIAGNOSIS — Z79899 Other long term (current) drug therapy: Secondary | ICD-10-CM

## 2017-12-17 LAB — COMPREHENSIVE METABOLIC PANEL
ALBUMIN: 4 g/dL (ref 3.5–5.0)
ALT: 16 U/L (ref 0–44)
AST: 20 U/L (ref 15–41)
Alkaline Phosphatase: 55 U/L (ref 38–126)
Anion gap: 9 (ref 5–15)
BUN: 14 mg/dL (ref 8–23)
CO2: 26 mmol/L (ref 22–32)
CREATININE: 0.91 mg/dL (ref 0.44–1.00)
Calcium: 9.7 mg/dL (ref 8.9–10.3)
Chloride: 108 mmol/L (ref 98–111)
GFR calc Af Amer: >60 mL/min (ref >60)
GLUCOSE: 101 mg/dL — AB (ref 70–99)
POTASSIUM: 4.3 mmol/L (ref 3.5–5.1)
Sodium: 143 mmol/L (ref 135–145)
Total Bilirubin: 0.6 mg/dL (ref 0.3–1.2)
Total Protein: 7 g/dL (ref 6.5–8.1)

## 2017-12-17 LAB — CBC WITH DIFFERENTIAL/PLATELET
Abs Immature Granulocytes: 0.01 10*3/uL (ref 0.00–0.07)
Basophils Absolute: 0.1 10*3/uL (ref 0.0–0.1)
Basophils Relative: 1 %
EOS ABS: 0.2 10*3/uL (ref 0.0–0.5)
EOS PCT: 3 %
HCT: 41.8 % (ref 36.0–46.0)
Hemoglobin: 13.4 g/dL (ref 12.0–15.0)
IMMATURE GRANULOCYTES: 0 %
LYMPHS ABS: 1.5 10*3/uL (ref 0.7–4.0)
Lymphocytes Relative: 29 %
MCH: 29.8 pg (ref 26.0–34.0)
MCHC: 32.1 g/dL (ref 30.0–36.0)
MCV: 92.9 fL (ref 80.0–100.0)
Monocytes Absolute: 0.4 10*3/uL (ref 0.1–1.0)
Monocytes Relative: 8 %
NEUTROS ABS: 3 10*3/uL (ref 1.7–7.7)
NEUTROS PCT: 59 %
Platelets: 223 10*3/uL (ref 150–400)
RBC: 4.5 MIL/uL (ref 3.87–5.11)
RDW: 11.8 % (ref 11.5–15.5)
WBC: 5.2 10*3/uL (ref 4.0–10.5)
nRBC: 0 % (ref 0.0–0.2)

## 2017-12-17 MED ORDER — TAMOXIFEN CITRATE 20 MG PO TABS: 20.0000 mg | ORAL_TABLET | Freq: Every day | ORAL | 12 refills | Status: AC

## 2017-12-17 NOTE — Telephone Encounter (Signed)
Gave patient avs and calendar.   °

## 2018-02-15 ENCOUNTER — Other Ambulatory Visit: Payer: Self-pay | Admitting: Adult Health

## 2018-02-15 DIAGNOSIS — C50811 Malignant neoplasm of overlapping sites of right female breast: Secondary | ICD-10-CM

## 2018-02-15 DIAGNOSIS — Z170 Estrogen receptor positive status [ER+]: Principal | ICD-10-CM

## 2018-03-09 ENCOUNTER — Telehealth: Payer: Self-pay | Admitting: *Deleted

## 2018-03-09 NOTE — Telephone Encounter (Signed)
Returned the patient call and moved her appt. Moved her appt from Dr. Gerarda Fraction on 5/13 to Dr. Skeet Latch on 5/14.

## 2018-05-13 ENCOUNTER — Telehealth: Payer: Self-pay | Admitting: *Deleted

## 2018-05-13 NOTE — Telephone Encounter (Signed)
Called and left the patient a message to call the office back. Need to see if the patient is still wanting to come in for her appt or have a virtual visit.

## 2018-05-13 NOTE — Progress Notes (Deleted)
[place] [facility] Note: New Patient FIRST VISIT   Consult was requested by Dr. Lurline Del for a history of uterine cancer desiring transfer of care closer to home   No chief complaint on file.   GYN Oncologic Summary 1. Stage IB, Grade 1 Endometrioid Endometrial Adenocarcinoma (Myo invasion =50% 0.7/1.4cm, neg LVSI) o 04/2014 - RA TLH, BSO, PLND 2. ? MMR   HPI: [name]  is a nice 73 y.o.  P2  Recent diagnosis of breast cancer and now being followed now by Dr. Jana Hakim. Would like to move all her care closer to home, here at [place].  Her uterine cancer oncologic course started with postmenopausal bleeding which was evaluated with a D&C by her gynecologist.  She was then referred on to [facility] for surgery.  Dr. Wille Celeste did take the patient for a RA TLH/BSO and PLND 04/13/2014 .  Final pathology revealed a grade 1 stage Ib endometrial cancer the pathology report describing a well-differentiated endometrioid adenocarcinoma extending to 50% of the myometrium, but with unremarkable cervix, bilateral adnexa, and 6 right and 8 left pelvic lymph nodes.  Depth of invasion was 0.7 out of 1.4 cm there was no lymphovascular space invasion.  The tumor size was 2.3 cm, endometrioid and grade 1  She did not receive any adjuvant radiation therapy.  She denies having any genetic counseling but does have some tumor profile testing for her breast cancer history.  I do not see that mismatch repair testing was done on her original hysterectomy specimen from 2016.  Currently she is taking tamoxifen for her breast cancer history. She has no pelvic pain no bleeding.   Imported EPIC Oncologic History:    Malignant neoplasm of overlapping sites of right breast in female, estrogen receptor positive ([place])   01/01/2017 Initial Biopsy    s/p Right breast overlapping sites biopsy x2 (different quadrants) on 01/01/2017 for a clinical mT2 N0,   Stage IB invasive lobular carcinoma, grade 2,  estrogen and progesterone receptor positive, HER-2 not amplified             (a) breast MRI on 01/16/2017 shows 2 additional areas of concern in the right breast    02/05/2017 Surgery    right mastectomy with sentinel lymph node sampling showed an mT2 N0, stage IB invasive lobular carcinoma, grade 2, with negative margins.  (Total of 4 lymph nodes removed    02/05/2017 Oncotype testing    The Oncotype DX score was 8, predicting a risk of outside the breast recurrence over the next 10 years of 3% if the patient's only systemic therapy is tamoxifen for 5 years.  It also predicts no significant benefit from chemotherapy.    02/27/2017 -  Anti-estrogen oral therapy    tamoxifen started 02/27/2017             (a) [facility] density at [facility] 12/12/2016 shows a T score of -3.2             (b) status post hysterectomy with bilateral salpingo-oophorectomy             (c) remote history of oral contraceptives with no complications    69/48/5462 Cancer Staging    Staging form: Breast, AJCC 8th Edition - Pathologic: Stage IA (pT2(m), pN0, cM0, G2, ER+, PR+, HER2-) - Signed by Gardenia Phlegm, NP on 10/21/2017     Measurement of disease: Will be clinical exam and annual  Pap every Nov  Radiology: No results found. .  .   Outpatient Encounter Medications as of 05/20/2018  Medication Sig  . Cholecalciferol (VITAMIN D3) 5000 units CAPS Take 5,000 Units by mouth daily.  Marland Kitchen doxylamine, Sleep, (SLEEP AID) 25 MG tablet Take 25 mg by mouth at bedtime as needed for sleep.  Marland Kitchen lisinopril (PRINIVIL,ZESTRIL) 20 MG tablet Take 20 mg by mouth daily.  . metroNIDAZOLE (METROCREAM) 0.75 % cream Apply 1 application topically daily as needed (rosacea). Patient applies to both eyes as needed  . Polyethyl Glycol-Propyl Glycol (SYSTANE OP) Apply 1 drop to eye daily as needed (dry eyes).  . venlafaxine XR (EFFEXOR-XR) 37.5 MG 24 hr capsule TAKE 1 CAPSULE BY MOUTH  ONCE DAILY WITH BREAKFAST   No facility-administered encounter medications on file as of 05/20/2018.    Allergies  Allergen Reactions  . Clindamycin Diarrhea    ? C.DIFF. ?    Past Medical History:  Diagnosis Date  . Breast cancer, right breast ([place])    S/P mastectomy 02/05/2017. Tamoxifen 2019 - ____  . Endometrial cancer ([place]) 2016    hysterectomy  . Hypertension   . Rosacea    mainly on face   Past Surgical History:  Procedure Laterality Date  . BREAST BIOPSY  2007   core biopsy "think it was on the left breast"  . BREAST BIOPSY Right 12/2016  . DILATION AND CURETTAGE OF UTERUS  1975   S/P miscarriage  . MASTECTOMY COMPLETE / SIMPLE W/ SENTINEL NODE BIOPSY Right 02/05/2017   RIGHT SIMPLE MASTECTOMY WITH AXILLARY SENTINEL NODE BIOPSY  . ROBOTIC ASSISTED TOTAL HYSTERECTOMY WITH BILATERAL SALPINGO OOPHERECTOMY  04/2014   and pelvic lymph node dissection - [facility]  . SIMPLE MASTECTOMY WITH AXILLARY SENTINEL NODE BIOPSY Right 02/05/2017   Procedure: RIGHT SIMPLE MASTECTOMY WITH AXILLARY SENTINEL NODE BIOPSY;  Surgeon: Erroll Luna, MD;  Location: [place];  Service: General;  Laterality: Right;        Past Gynecological History:   GYNECOLOGIC HISTORY:  . No LMP recorded. Patient has had a hysterectomy. late 19's . Menarche: 73 years old . P 2 . Contraceptive OCP . HRT none  . Last Pap NA Family Hx:  Family History  Problem Relation Age of Onset  . Colon cancer Father 10   Social Hx:  Marland Kitchen Tobacco use: none . Alcohol use: 1-2 / month . Illicit Drug use: none . Illicit IV Drug use: none    Review of Systems: Review of Systems  Constitutional: Negative.   HENT:  Negative.   Eyes: Negative.   Respiratory: Negative.   Cardiovascular: Negative.   Gastrointestinal: Negative.   Endocrine: Negative.   Genitourinary: Negative.    Musculoskeletal: Negative.   Skin: Negative.   Neurological: Negative.   Hematological: Negative.   Psychiatric/Behavioral: Negative.      Vitals:  There were no vitals filed for this visit. There were no vitals filed for this visit. There is no height or weight on file to calculate BMI.  Physical Exam: General :  Well developed, 73 y.o., female in no apparent distress HEENT:  Normocephalic/atraumatic, symmetric, EOMI, eyelids normal Neck:   Supple, no masses.  Lymphatics:  No cervical/ submandibular/ supraclavicular/ infraclavicular/ inguinal adenopathy Respiratory:  Respirations unlabored, no use of accessory muscles CV:   Deferred Breast:  Deferred Musculoskeletal: No CVA tenderness, normal muscle strength. Abdomen:  Trocar sites soft.  Soft, non-tender and nondistended. No evidence of hernia. No masses. Extremities:  No lymphedema, no erythema, non-tender. Skin:  Normal inspection Neuro/Psych:  No focal motor deficit, no abnormal mental status. Normal gait. Normal affect. Alert and oriented to person, place, and time  Genito Urinary: Vulva: Normal external female genitalia.  Bladder/urethra: Urethral meatus normal in size and location. No lesions or   masses, well supported bladder Speculum exam: Vagina: No lesion, no discharge, no bleeding. Bimanual exam: Cervix/Uterus/Adnexa: Surgically absent  Adnexal region: No masses. Rectovaginal:  Good tone, no masses, no cul de sac nodularity, no parametrial involvement or nodularity.   Assessment  Endometrial CA ECOG PERFORMANCE STATUS: 0 - Asymptomatic  Plan   1. Surveillance plan ? Followup for pelvic Q6 months until 04/2019 then see about dispo ? Annual pap, discussed no clinical evidence however highest risk recurrence at vaginal cuff given she did not have RT 2. Breast cancer per Dr. Jana Hakim ? We discussed that the Tamoxifen, while possibly leading to uterine cancer, I feel that the benefit for her breast cancer outcome outweighs the risk of recurrence of her uterine cacner 3. Genetic counseling o I encouraged her to consider genetic testing given her  father had colon cancer, albeit in his 77's. o We don't have an MMR on the tissue to reassure ourselves o Medicare should cover this cost o We discussed the GINA act   Face to face time with patient was 60 minutes. Over 50% of this time was spent on counseling and coordination of care.   Mart Piggs, MD Gynecologic Oncologist 05/13/2018, 10:08 AM    Cc: Lurline Del, MD (Referring Med Onc) Kristopher Glee., MD (PCP)

## 2018-05-13 NOTE — Telephone Encounter (Signed)
Patient called back and wants to keep her in person visit on 5/14

## 2018-05-14 ENCOUNTER — Telehealth: Payer: Self-pay | Admitting: *Deleted

## 2018-05-14 NOTE — Telephone Encounter (Signed)
Patient called and moved her appt from 5/14 to 6/11

## 2018-05-18 ENCOUNTER — Other Ambulatory Visit: Payer: Self-pay | Admitting: Adult Health

## 2018-05-18 DIAGNOSIS — C50811 Malignant neoplasm of overlapping sites of right female breast: Secondary | ICD-10-CM

## 2018-05-19 ENCOUNTER — Ambulatory Visit: Payer: Medicare Other | Admitting: Obstetrics

## 2018-05-20 ENCOUNTER — Ambulatory Visit: Payer: Medicare Other | Admitting: Gynecologic Oncology

## 2018-06-15 NOTE — Progress Notes (Signed)
[place] at [facility]      Referring physician:   Dr. Lurline Del   Chief Complaint  Patient presents with  . Endometrial cancer [facility])    GYN Oncologic Summary 1. Stage IB, Grade 1 Endometrioid Endometrial Adenocarcinoma (Myo invasion =50% 0.7/1.4cm, neg LVSI) o 04/2014 - RA TLH, BSO, PLND 2. ? MMR   HPI: [name]  is a nice 73 y.o.  P2  Recent diagnosis of breast cancer and now being followed now by Dr. Jana Hakim. Would like to move all her care closer to home, here at [place].  Her uterine cancer oncologic course started with postmenopausal bleeding which was evaluated with a D&C by her gynecologist.  She was then referred on to [facility] for surgery.  Dr. Wille Celeste did take the patient for a RA TLH/BSO and PLND 04/13/2014 .  Final pathology revealed a grade 1 stage Ib endometrial cancer the pathology report describing a well-differentiated endometrioid adenocarcinoma extending to 50% of the myometrium, but with unremarkable cervix, bilateral adnexa, and 6 right and 8 left pelvic lymph nodes.  Depth of invasion was 0.7 out of 1.4 cm there was no lymphovascular space invasion.  The tumor size was 2.3 cm, endometrioid and grade 1  She did not receive any adjuvant radiation therapy.  She denies having any genetic counseling but does have some tumor profile testing for her breast cancer history.  I do not see that mismatch repair testing was done on her original hysterectomy specimen from 2016.  Currently she is taking tamoxifen for her breast cancer history. She has no pelvic pain no bleeding.   Imported EPIC Oncologic History:  Oncology History  Malignant neoplasm of overlapping sites of right breast in female, estrogen receptor positive ([place])  01/01/2017 Initial Biopsy   s/p Right breast overlapping sites biopsy x2 (different quadrants) on 01/01/2017 for a clinical mT2 N0,  Stage IB invasive lobular carcinoma, grade 2,  estrogen  and progesterone receptor positive, HER-2 not amplified             (a) breast MRI on 01/16/2017 shows 2 additional areas of concern in the right breast   02/05/2017 Surgery   right mastectomy with sentinel lymph node sampling showed an mT2 N0, stage IB invasive lobular carcinoma, grade 2, with negative margins.  (Total of 4 lymph nodes removed   02/05/2017 Oncotype testing   The Oncotype DX score was 8, predicting a risk of outside the breast recurrence over the next 10 years of 3% if the patient's only systemic therapy is tamoxifen for 5 years.  It also predicts no significant benefit from chemotherapy.   02/27/2017 -  Anti-estrogen oral therapy   tamoxifen started 02/27/2017             (a) [facility] density at [facility] 12/12/2016 shows a T score of -3.2             (b) status post hysterectomy with bilateral salpingo-oophorectomy             (c) remote history of oral contraceptives with no complications   34/19/3790 Cancer Staging   Staging form: Breast, AJCC 8th Edition - Pathologic: Stage IA (pT2(m), pN0, cM0, G2, ER+, PR+, HER2-) - Signed by Gardenia Phlegm, NP on 10/21/2017    .   Outpatient Encounter Medications as of 06/17/2018  Medication Sig  . Cholecalciferol (VITAMIN D3) 5000 units CAPS Take 5,000 Units by mouth daily.  Marland Kitchen doxylamine, Sleep, (  SLEEP AID) 25 MG tablet Take 25 mg by mouth at bedtime as needed for sleep.  Marland Kitchen lisinopril (PRINIVIL,ZESTRIL) 20 MG tablet Take 20 mg by mouth daily.  . metroNIDAZOLE (METROCREAM) 0.75 % cream Apply 1 application topically daily as needed (rosacea). Patient applies to both eyes as needed  . Polyethyl Glycol-Propyl Glycol (SYSTANE OP) Apply 1 drop to eye daily as needed (dry eyes).  . venlafaxine XR (EFFEXOR-XR) 37.5 MG 24 hr capsule TAKE 1 CAPSULE BY MOUTH ONCE DAILY WITH BREAKFAST   No facility-administered encounter medications on file as of 06/17/2018.    Allergies  Allergen Reactions  . Clindamycin Diarrhea    ? C.DIFF. ?     Past Medical History:  Diagnosis Date  . Breast cancer, right breast ([place])    S/P mastectomy 02/05/2017. Tamoxifen 2019 - ____  . Endometrial cancer ([place]) 2016    hysterectomy  . Hypertension   . Rosacea    mainly on face   Past Surgical History:  Procedure Laterality Date  . BREAST BIOPSY  2007   core biopsy "think it was on the left breast"  . BREAST BIOPSY Right 12/2016  . DILATION AND CURETTAGE OF UTERUS  1975   S/P miscarriage  . MASTECTOMY COMPLETE / SIMPLE W/ SENTINEL NODE BIOPSY Right 02/05/2017   RIGHT SIMPLE MASTECTOMY WITH AXILLARY SENTINEL NODE BIOPSY  . ROBOTIC ASSISTED TOTAL HYSTERECTOMY WITH BILATERAL SALPINGO OOPHERECTOMY  04/2014   and pelvic lymph node dissection - [facility]  . SIMPLE MASTECTOMY WITH AXILLARY SENTINEL NODE BIOPSY Right 02/05/2017   Procedure: RIGHT SIMPLE MASTECTOMY WITH AXILLARY SENTINEL NODE BIOPSY;  Surgeon: Erroll Luna, MD;  Location: [place];  Service: General;  Laterality: Right;        Past Gynecological History:   GYNECOLOGIC HISTORY:  . No LMP recorded. Patient has had a hysterectomy. late 19's . Menarche: 73 years old . P 2 . Contraceptive OCP . HRT none  . Last Pap NA Family Hx:  Family History  Problem Relation Age of Onset  . Colon cancer Father 55   Social Hx:  Marland Kitchen Tobacco use: none . Alcohol use: 1-2 / month . Illicit Drug use: none . Illicit IV Drug use: none    Review of Systems: Review of Systems  Constitutional: Negative.   HENT:  Negative.   Eyes: Negative.   Respiratory: Negative.   Cardiovascular: Negative.   Gastrointestinal: Negative.   Endocrine: Negative.   Genitourinary: Negative.    Musculoskeletal: Negative.   Skin: Negative.   Neurological: Negative.   Hematological: Negative.   Psychiatric/Behavioral: Negative.     Vitals:  Vitals:   06/17/18 1136  BP: (!) 159/90  Pulse: (!) 111  Resp: 18  Temp: 98.9 F (37.2 C)  SpO2: 100%  Body mass index is 27.84 kg/m.   Physical  Exam: General :  Well developed, 73 y.o., female in no apparent distress HEENT:  Normocephalic/atraumatic, symmetric, EOMI, eyelids normal Neck:   Supple, no masses.  Lymphatics:  No cervical/ submandibular/ supraclavicular/ infraclavicular/ inguinal adenopathy Respiratory:  Respirations unlabored, no use of accessory muscles CV:   Deferred Breast:  Deferred Musculoskeletal: No CVA tenderness, normal muscle strength. Abdomen:  .  Soft, non-tender and nondistended. No evidence of hernia. No masses. At port sites Extremities:  No lymphedema, no erythema, non-tender. Skin:   Normal inspection Neuro/Psych:  No focal motor deficit, no abnormal mental status. Normal gait. Normal affect. Alert and oriented to person, place, and time  Genito Urinary: Vulva: Normal external female  genitalia.  Bladder/urethra: Urethral meatus normal in size and location. No lesions or   masses, well supported bladder Speculum exam: Vagina: No lesion, no discharge, atrophic, no bleeding.enterocele Bimanual exam: Cervix/Uterus/Adnexa: Surgically absent  Adnexal region: No masses. Rectovaginal:  Good tone, no masses, no cul de sac nodularity, no parametrial involvement or nodularity.   Assessment  Endometrial CA ECOG PERFORMANCE STATUS: 0 - Asymptomatic  Plan   1. Surveillance plan ? Followup for pelvic Q6 months until 04/2019  ? F/U 12/2018 2. Breast cancer per Dr. Jana Hakim 3. We discussed that the Tamoxifen, Genetic counseling o I encouraged her to consider genetic testing given her father had colon cancer, albeit in his 74's.   Face to face time with patient was 60 minutes. Over 50% of this time was spent on counseling and coordination of care.    Cc: Lurline Del, MD (Referring Med Onc) Kristopher Glee., MD (PCP)

## 2018-06-17 ENCOUNTER — Inpatient Hospital Stay: Payer: Medicare Other | Attending: Obstetrics | Admitting: Gynecologic Oncology

## 2018-06-17 ENCOUNTER — Other Ambulatory Visit: Payer: Self-pay

## 2018-06-17 ENCOUNTER — Encounter: Payer: Self-pay | Admitting: Gynecologic Oncology

## 2018-06-17 VITALS — BP 159/90 | HR 111 | Temp 98.9°F | Resp 18 | Ht 64.0 in | Wt 162.2 lb

## 2018-06-17 DIAGNOSIS — Z79810 Long term (current) use of selective estrogen receptor modulators (SERMs): Secondary | ICD-10-CM | POA: Insufficient documentation

## 2018-06-17 DIAGNOSIS — Z90710 Acquired absence of both cervix and uterus: Secondary | ICD-10-CM | POA: Insufficient documentation

## 2018-06-17 DIAGNOSIS — Z9011 Acquired absence of right breast and nipple: Secondary | ICD-10-CM | POA: Insufficient documentation

## 2018-06-17 DIAGNOSIS — C50811 Malignant neoplasm of overlapping sites of right female breast: Secondary | ICD-10-CM | POA: Diagnosis not present

## 2018-06-17 DIAGNOSIS — Z170 Estrogen receptor positive status [ER+]: Secondary | ICD-10-CM | POA: Insufficient documentation

## 2018-06-17 DIAGNOSIS — C541 Malignant neoplasm of endometrium: Secondary | ICD-10-CM | POA: Insufficient documentation

## 2018-06-17 DIAGNOSIS — Z90722 Acquired absence of ovaries, bilateral: Secondary | ICD-10-CM | POA: Diagnosis not present

## 2018-06-17 NOTE — Patient Instructions (Signed)
We have placed a referral to genetics.  Plan to follow up in six months or sooner if needed.  Please call in October or November to schedule an appointment for December 2020 at [phone].  Please call for any questions, concerns, or new symptoms such as vaginal bleeding, abdominal pain, or any new persistent symptoms.

## 2018-07-12 ENCOUNTER — Telehealth: Payer: Self-pay | Admitting: Licensed Clinical Social Worker

## 2018-07-12 NOTE — Telephone Encounter (Signed)
Doris Maynard had questions about her upcoming genetic counseling appointment. I answered her questions regarding billing and what genetic counseling is, and she wants to keep her appointment and would prefer to do it virtually. I let her know someone would call her to assist in setting this up prior to her appointment.

## 2018-07-28 ENCOUNTER — Other Ambulatory Visit: Payer: Medicare Other

## 2018-07-28 ENCOUNTER — Encounter: Payer: Medicare Other | Admitting: Genetic Counselor

## 2018-08-12 ENCOUNTER — Other Ambulatory Visit: Payer: Medicare Other

## 2018-08-12 ENCOUNTER — Encounter: Payer: Medicare Other | Admitting: Licensed Clinical Social Worker

## 2018-08-31 ENCOUNTER — Encounter: Payer: Medicare Other | Admitting: Genetic Counselor

## 2018-09-03 ENCOUNTER — Telehealth: Payer: Self-pay | Admitting: Licensed Clinical Social Worker

## 2018-09-03 NOTE — Telephone Encounter (Signed)
Called patient regarding upcoming [facility] appointment, screener test complete and e-mail has been sent.  °

## 2018-09-06 ENCOUNTER — Encounter: Payer: Self-pay | Admitting: Licensed Clinical Social Worker

## 2018-09-06 ENCOUNTER — Inpatient Hospital Stay: Payer: Medicare Other | Attending: Obstetrics | Admitting: Licensed Clinical Social Worker

## 2018-09-06 DIAGNOSIS — Z170 Estrogen receptor positive status [ER+]: Secondary | ICD-10-CM | POA: Diagnosis not present

## 2018-09-06 DIAGNOSIS — Z800 Family history of malignant neoplasm of digestive organs: Secondary | ICD-10-CM | POA: Diagnosis not present

## 2018-09-06 DIAGNOSIS — C50811 Malignant neoplasm of overlapping sites of right female breast: Secondary | ICD-10-CM | POA: Diagnosis not present

## 2018-09-06 DIAGNOSIS — C541 Malignant neoplasm of endometrium: Secondary | ICD-10-CM

## 2018-09-06 DIAGNOSIS — Z1379 Encounter for other screening for genetic and chromosomal anomalies: Secondary | ICD-10-CM

## 2018-09-06 NOTE — Progress Notes (Signed)
REFERRING PROVIDER: Dorothyann Gibbs, NP [place],  [place] [address]  PRIMARY PROVIDER:  Kristopher Glee., MD  PRIMARY REASON FOR VISIT:  1. Malignant neoplasm of overlapping sites of right breast in female, estrogen receptor positive ([place])   2. Family history of colon cancer   3. Endometrial cancer ([place])    I connected with Ms. Deyarmin on 09/06/2018 at 11:00 AM EDT by Webex and verified that I am speaking with the correct person using two identifiers.    Patient location: home Provider location: office   HISTORY OF PRESENT ILLNESS:   Ms. Fenster, a 73 y.o. female, was seen for a [place] cancer genetics consultation due to a personal and family history of cancer.  Ms. Perfecto presents to clinic today to discuss the possibility of a hereditary predisposition to cancer, genetic testing, and to further clarify her future cancer risks, as well as potential cancer risks for family members.   At the age of 50, Ms. Sammon was diagnosed with endometrial cancer. This was treated with TAH-BSO, MSI/IHC not performed.  At the age of 71, Ms. Neubecker was diagnosed with ILC of the right breast, ER/PR+, Her2-. This was treated with mastectomy and she is currently on tamoxifen.     CANCER HISTORY:  Oncology History  Malignant neoplasm of overlapping sites of right breast in female, estrogen receptor positive ([place])  01/01/2017 Initial Biopsy   s/p Right breast overlapping sites biopsy x2 (different quadrants) on 01/01/2017 for a clinical mT2 N0,  Stage IB invasive lobular carcinoma, grade 2,  estrogen and progesterone receptor positive, HER-2 not amplified             (a) breast MRI on 01/16/2017 shows 2 additional areas of concern in the right breast   02/05/2017 Surgery   right mastectomy with sentinel lymph node sampling showed an mT2 N0, stage IB invasive lobular carcinoma, grade 2, with negative margins.  (Total of 4 lymph nodes removed   02/05/2017 Oncotype testing   The  Oncotype DX score was 8, predicting a risk of outside the breast recurrence over the next 10 years of 3% if the patient's only systemic therapy is tamoxifen for 5 years.  It also predicts no significant benefit from chemotherapy.   02/27/2017 -  Anti-estrogen oral therapy   tamoxifen started 02/27/2017             (a) bone density at [facility] 12/12/2016 shows a T score of -3.2             (b) status post hysterectomy with bilateral salpingo-oophorectomy             (c) remote history of oral contraceptives with no complications   24/09/7351 Cancer Staging   Staging form: Breast, AJCC 8th Edition - Pathologic: Stage IA (pT2(m), pN0, cM0, G2, ER+, PR+, HER2-) - Signed by Gardenia Phlegm, NP on 10/21/2017      RISK FACTORS:  Menarche was at age 26.  First live birth at age 71. OCP use for approximately 25 years.  Ovaries intact: no.  Hysterectomy: yes.  Menopausal status: postmenopausal- in 94s HRT use: 0 years. Colonoscopy: yes; she reports a few polyps. Mammogram within the last year: yes. Number of breast biopsies: 2. Up to date with pelvic exams: yes. Any excessive radiation exposure in the past: no  Past Medical History:  Diagnosis Date  . Breast cancer, right breast ([place])    S/P mastectomy 02/05/2017. Tamoxifen 2019 - ____  .  Endometrial cancer ([place]) 2016    hysterectomy  . Family history of colon cancer   . Hypertension   . Rosacea    mainly on face    Past Surgical History:  Procedure Laterality Date  . BREAST BIOPSY  2007   core biopsy "think it was on the left breast"  . BREAST BIOPSY Right 12/2016  . DILATION AND CURETTAGE OF UTERUS  1975   S/P miscarriage  . MASTECTOMY COMPLETE / SIMPLE W/ SENTINEL NODE BIOPSY Right 02/05/2017   RIGHT SIMPLE MASTECTOMY WITH AXILLARY SENTINEL NODE BIOPSY  . ROBOTIC ASSISTED TOTAL HYSTERECTOMY WITH BILATERAL SALPINGO OOPHERECTOMY  04/2014   and pelvic lymph node dissection - [facility]  . SIMPLE MASTECTOMY WITH  AXILLARY SENTINEL NODE BIOPSY Right 02/05/2017   Procedure: RIGHT SIMPLE MASTECTOMY WITH AXILLARY SENTINEL NODE BIOPSY;  Surgeon: Erroll Luna, MD;  Location: [facility];  Service: General;  Laterality: Right;    Social History   Socioeconomic History  . Marital status: Married    Spouse name: Not on file  . Number of children: Not on file  . Years of education: Not on file  . Highest education level: Not on file  Occupational History  . Not on file  Social Needs  . Financial resource strain: Not on file  . Food insecurity    Worry: Not on file    Inability: Not on file  . Transportation needs    Medical: Not on file    Non-medical: Not on file  Tobacco Use  . Smoking status: Never Smoker  . Smokeless tobacco: Never Used  Substance and Sexual Activity  . Alcohol use: Yes    Comment: 02/05/2017 will drink wine once or twice a month  . Drug use: No  . Sexual activity: Not Currently  Lifestyle  . Physical activity    Days per week: Not on file    Minutes per session: Not on file  . Stress: Not on file  Relationships  . Social Herbalist on phone: Not on file    Gets together: Not on file    Attends religious service: Not on file    Active member of club or organization: Not on file    Attends meetings of clubs or organizations: Not on file    Relationship status: Not on file  Other Topics Concern  . Not on file  Social History Narrative  . Not on file     FAMILY HISTORY:  We obtained a detailed, 4-generation family history.  Significant diagnoses are listed below: Family History  Problem Relation Age of Onset  . Colon cancer Father 69   Ms. Donaghy has one brother who passed at 26, no cancers. She has a son, 18, and a daughter, 30, no cancers.  Ms. Dzikowski's mother died at 61, no history of cancer. Patient had 1 maternal uncle, 1 maternal aunt, no cancers, no known cancers in maternal cousins. Her maternal grandfather died at 75 of kidney disease and  grandmother died at 87, no cancers.  Ms. Lauro's father was diagnosed with colon cancer at 14 and died at 95. Patient had 3 paternal uncles, 1 paternal aunt, no cancers. No cancers in paternal cousins. Her paternal grandparents both died at 19, no cancers.  Ms. Cunning is unaware of previous family history of genetic testing for hereditary cancer risks. Patient's maternal ancestors are of [place] descent, and paternal ancestors are of [place] and [place] descent. There is no reported Ashkenazi Jewish ancestry. There  is no known consanguinity.  GENETIC COUNSELING ASSESSMENT: Ms. Espericueta is a 73 y.o. female with a personal and family history which is not particularly suggestive of a hereditary cancer syndrome and predisposition to cancer. However, she was still interested in testing for her daughter.  We, therefore, discussed and recommended the following at today's visit.   DISCUSSION: We discussed that 5 - 10% of breast cancer is hereditary, with most cases associated with BRCA1/BRCA2 mutations.  There are other genes that can be associated with hereditary breast cancer syndromes.  There are also hereditary endometrial cancer syndromes, most commonly we think of Lynch syndrome.   We discussed that testing is beneficial for several reasons including knowing how to follow individuals after completing their treatment, and understand if other family members could be at risk for cancer and allow them to undergo genetic testing.   We reviewed the characteristics, features and inheritance patterns of hereditary cancer syndromes. We also discussed genetic testing, including the appropriate family members to test, the process of testing, insurance coverage and turn-around-time for results. We discussed the implications of a negative, positive and/or variant of uncertain significant result. We recommended Ms. Polan pursue genetic testing for the Common Hereditary Cancers gene panel.   The Common  Hereditary Cancers Panel offered by [facility] includes sequencing and/or deletion duplication testing of the following 48 genes: APC, ATM, AXIN2, BARD1, BMPR1A, BRCA1, BRCA2, BRIP1, CDH1, CDKN2A (p14ARF), CDKN2A (p16INK4a), CKD4, CHEK2, CTNNA1, DICER1, EPCAM (Deletion/duplication testing only), GREM1 (promoter region deletion/duplication testing only), KIT, MEN1, MLH1, MSH2, MSH3, MSH6, MUTYH, NBN, NF1, NHTL1, PALB2, PDGFRA, PMS2, POLD1, POLE, PTEN, RAD50, RAD51C, RAD51D, RNF43, SDHB, SDHC, SDHD, SMAD4, SMARCA4. STK11, TP53, TSC1, TSC2, and VHL.  The following genes were evaluated for sequence changes only: SDHA and HOXB13 c.251G>A variant only.  Based on Ms. Mineer's personal and family history of cancer, she does not meet medical criteria for genetic testing. She may have an out of pocket cost.   PLAN: After considering the risks, benefits, and limitations, Ms. Godfrey provided informed consent to pursue genetic testing. A saliva kit will be mailed to her home, and she will send her sample to [facility] for analysis of the Common Hereditary Cancers Panel. Results should be available within approximately 2-3 weeks' time, at which point they will be disclosed by telephone to Ms. Marrazzo, as will any additional recommendations warranted by these results. Ms. Maret will receive a summary of her genetic counseling visit and a copy of her results once available. This information will also be available in Epic.   Lastly, we encouraged Ms. Whan to remain in contact with cancer genetics annually so that we can continuously update the family history and inform her of any changes in cancer genetics and testing that may be of benefit for this family.   Ms. Hockley's questions were answered to her satisfaction today. Our contact information was provided should additional questions or concerns arise. Thank you for the referral and allowing [place] to share in the care of your patient.   Faith Rogue, MS, [facility] Genetic Counselor [email or web address] Phone: [phone]  The patient was seen for a total of 40 minutes in virtual genetic counseling. [employer] Intern Amy Danelle Earthly was also part of the session.  Drs. Magrinat, Lindi Adie and/or Burr Medico were available for discussion regarding this case.   _______________________________________________________________________ For Office Staff:  Number of people involved in session: 2 Was an Intern/ student involved with case: yes

## 2018-09-11 ENCOUNTER — Other Ambulatory Visit: Payer: Self-pay | Admitting: Adult Health

## 2018-09-11 DIAGNOSIS — C50811 Malignant neoplasm of overlapping sites of right female breast: Secondary | ICD-10-CM

## 2018-09-11 DIAGNOSIS — Z170 Estrogen receptor positive status [ER+]: Secondary | ICD-10-CM

## 2018-09-20 ENCOUNTER — Telehealth: Payer: Self-pay

## 2018-09-20 NOTE — Telephone Encounter (Signed)
Told Doris Maynard that Dr. Skeet Latch will not be seeing patient's in the [facility] office after October. Offered for her to see Dr. Skeet Latch at [facility]. Doris Maynard prefers to keep her care in [place]. Told Her that our scheduler will call In October with a December appointment with one of the providers. Doris Maynard will call the office if she has not heard from the office by the end of October.

## 2018-10-21 ENCOUNTER — Telehealth: Payer: Self-pay | Admitting: Licensed Clinical Social Worker

## 2018-10-25 ENCOUNTER — Ambulatory Visit: Payer: Self-pay | Admitting: Licensed Clinical Social Worker

## 2018-10-25 ENCOUNTER — Encounter: Payer: Self-pay | Admitting: Licensed Clinical Social Worker

## 2018-10-25 DIAGNOSIS — C541 Malignant neoplasm of endometrium: Secondary | ICD-10-CM

## 2018-10-25 DIAGNOSIS — Z1379 Encounter for other screening for genetic and chromosomal anomalies: Secondary | ICD-10-CM | POA: Insufficient documentation

## 2018-10-25 DIAGNOSIS — C50811 Malignant neoplasm of overlapping sites of right female breast: Secondary | ICD-10-CM

## 2018-10-25 DIAGNOSIS — Z170 Estrogen receptor positive status [ER+]: Secondary | ICD-10-CM

## 2018-10-25 DIAGNOSIS — Z800 Family history of malignant neoplasm of digestive organs: Secondary | ICD-10-CM

## 2018-10-25 NOTE — Progress Notes (Signed)
HPI:  Ms. Crespo was previously seen in the [place] clinic due to a personal and family history of cancer and concerns regarding a hereditary predisposition to cancer. Please refer to our prior cancer genetics clinic note for more information regarding our discussion, assessment and recommendations, at the time. Ms. Knaus's recent genetic test results were disclosed to her, as were recommendations warranted by these results. These results and recommendations are discussed in more detail below.  CANCER HISTORY:  Oncology History  Malignant neoplasm of overlapping sites of right breast in female, estrogen receptor positive ([place])  01/01/2017 Initial Biopsy   s/p Right breast overlapping sites biopsy x2 (different quadrants) on 01/01/2017 for a clinical mT2 N0,  Stage IB invasive lobular carcinoma, grade 2,  estrogen and progesterone receptor positive, HER-2 not amplified             (a) breast MRI on 01/16/2017 shows 2 additional areas of concern in the right breast   02/05/2017 Surgery   right mastectomy with sentinel lymph node sampling showed an mT2 N0, stage IB invasive lobular carcinoma, grade 2, with negative margins.  (Total of 4 lymph nodes removed   02/05/2017 Oncotype testing   The Oncotype DX score was 8, predicting a risk of outside the breast recurrence over the next 10 years of 3% if the patient's only systemic therapy is tamoxifen for 5 years.  It also predicts no significant benefit from chemotherapy.   02/27/2017 -  Anti-estrogen oral therapy   tamoxifen started 02/27/2017             (a) bone density at [facility] 12/12/2016 shows a T score of -3.2             (b) status post hysterectomy with bilateral salpingo-oophorectomy             (c) remote history of oral contraceptives with no complications   79/48/0165 Cancer Staging   Staging form: Breast, AJCC 8th Edition - Pathologic: Stage IA (pT2(m), pN0, cM0, G2, ER+, PR+, HER2-) - Signed by Gardenia Phlegm, NP on 10/21/2017     FAMILY HISTORY:  We obtained a detailed, 4-generation family history.  Significant diagnoses are listed below: Family History  Problem Relation Age of Onset  . Colon cancer Father 51   Ms. Barritt has one brother who passed at 65, no cancers. She has a son, 77, and a daughter, 41, no cancers.  Ms. Goerner's mother died at 34, no history of cancer. Patient had 1 maternal uncle, 1 maternal aunt, no cancers, no known cancers in maternal cousins. Her maternal grandfather died at 40 of kidney disease and grandmother died at 45, no cancers.  Ms. Aiello's father was diagnosed with colon cancer at 75 and died at 109. Patient had 3 paternal uncles, 1 paternal aunt, no cancers. No cancers in paternal cousins. Her paternal grandparents both died at 25, no cancers.  Ms. Calderone is unaware of previous family history of genetic testing for hereditary cancer risks. Patient's maternal ancestors are of [place] descent, and paternal ancestors are of [place] and [place] descent. There is no reported Ashkenazi Jewish ancestry. There is no known consanguinity.  GENETIC TEST RESULTS: Genetic testing reported out on 10/20/2018 through the [facility] Common Hereditary cancer panel found no pathogenic mutations. The Common Hereditary Cancers Panel offered by [facility] includes sequencing and/or deletion duplication testing of the following 48 genes: APC, ATM, AXIN2, BARD1, BMPR1A, BRCA1, BRCA2, BRIP1, CDH1, CDKN2A (p14ARF), CDKN2A (p16INK4a), CKD4, CHEK2, CTNNA1,  DICER1, EPCAM (Deletion/duplication testing only), GREM1 (promoter region deletion/duplication testing only), KIT, MEN1, MLH1, MSH2, MSH3, MSH6, MUTYH, NBN, NF1, NHTL1, PALB2, PDGFRA, PMS2, POLD1, POLE, PTEN, RAD50, RAD51C, RAD51D, RNF43, SDHB, SDHC, SDHD, SMAD4, SMARCA4. STK11, TP53, TSC1, TSC2, and VHL.  The following genes were evaluated for sequence changes only: SDHA and HOXB13 c.251G>A variant only. The test  report has been scanned into EPIC and is located under the Molecular Pathology section of the Results Review tab.  A portion of the result report is included below for reference.     We discussed with Ms. Castellana that because current genetic testing is not perfect, it is possible there may be a gene mutation in one of these genes that current testing cannot detect, but that chance is small.  We also discussed, that there could be another gene that has not yet been discovered, or that we have not yet tested, that is responsible for the cancer diagnoses in the family. It is also possible there is a hereditary cause for the cancer in the family that Ms. Lung did not inherit and therefore was not identified in her testing.  Therefore, it is important to remain in touch with cancer genetics in the future so that we can continue to offer Ms. Fitzmaurice the most up to date genetic testing.   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 At this time, it is unknown if these variants are associated with increased cancer risk or if they are normal findings, but most variants such as these get reclassified to being inconsequential. They should not be used to make medical management decisions. With time, we suspect the lab will determine the significance of these variants, if any. If we do learn more about them, we will try to contact Ms. Ausborn to discuss it further. However, it is important to stay in touch with [place] periodically and keep the address and phone number up to date.  ADDITIONAL GENETIC TESTING: We discussed with Ms. Garn that her genetic testing was fairly extensive.  If there are genes identified to increase cancer risk that can be analyzed in the future, we would be happy to discuss and coordinate this testing at that time.    CANCER SCREENING RECOMMENDATIONS: Ms. Amaker's test  result is considered negative (normal).  This means that we have not identified a hereditary cause for her personal and family history of cancer at this time. Most cancers happen by chance and this negative test suggests that her cancer may fall into this category.    While reassuring, this does not definitively rule out a hereditary predisposition to cancer. It is still possible that there could be genetic mutations that are undetectable by current technology. There could be genetic mutations in genes that have not been tested or identified to increase cancer risk.  Therefore, it is recommended she continue to follow the cancer management and screening guidelines provided by her oncology and primary healthcare provider.   An individual's cancer risk and medical management are not determined by genetic test results alone. Overall cancer risk assessment incorporates additional factors, including personal medical history, family history, and any available genetic information that may result in a personalized plan for cancer prevention and surveillance  RECOMMENDATIONS FOR FAMILY MEMBERS:  Relatives in this family might be at some increased risk of developing cancer, over the general population risk, simply due to the family  history of cancer.  We recommended female relatives in this family have a yearly mammogram beginning at age 55, or 49 years younger than the earliest onset of cancer, an annual clinical breast exam, and perform monthly breast self-exams. Female relatives in this family should also have a gynecological exam as recommended by their primary provider. All family members should have a colonoscopy by age 34, or as directed by their physicians.  FOLLOW-UP: Lastly, we discussed with Ms. Retzloff that cancer genetics is a rapidly advancing field and it is possible that new genetic tests will be appropriate for her and/or her family members in the future. We encouraged her to remain in contact with  cancer genetics on an annual basis so we can update her personal and family histories and let her know of advances in cancer genetics that may benefit this family.   Our contact number was provided. Ms. Geske's questions were answered to her satisfaction, and she knows she is welcome to call us at anytime with additional questions or concerns.   Faith Rogue, MS, [facility] Genetic Counselor [email or web address] Phone: [phone]

## 2018-10-25 NOTE — Telephone Encounter (Signed)
Revealed negative genetic testing.  Revealed that a VUS in AXIN2 and PMS2 were identified. This normal result is reassuring and indicates that it is unlikely Doris Maynard's cancer is due to a hereditary cause.  It is unlikely that there is an increased risk of another cancer due to a mutation in one of these genes.  However, genetic testing is not perfect, and cannot definitively rule out a hereditary cause.  It will be important for her to keep in contact with genetics to learn if any additional testing may be needed in the future.

## 2018-10-26 NOTE — Progress Notes (Signed)
Error

## 2018-11-05 ENCOUNTER — Other Ambulatory Visit: Payer: Self-pay | Admitting: Oncology

## 2018-11-05 ENCOUNTER — Telehealth: Payer: Self-pay | Admitting: Oncology

## 2018-11-05 DIAGNOSIS — Z1231 Encounter for screening mammogram for malignant neoplasm of breast: Secondary | ICD-10-CM

## 2018-11-05 NOTE — Telephone Encounter (Signed)
Returned patient's phone call regarding rescheduling December appointments, per patient's request appointment has been rescheduled due to Mammogram date.

## 2018-11-19 ENCOUNTER — Telehealth: Payer: Self-pay | Admitting: *Deleted

## 2018-11-19 ENCOUNTER — Telehealth: Payer: Self-pay

## 2018-11-19 NOTE — Telephone Encounter (Signed)
Called and left the patient a message to call the office back to schedule an appt

## 2018-11-19 NOTE — Telephone Encounter (Signed)
Pt called to inquire about follow up with oncology gynecologic physician.    RN notified staff with OB/GYN.  They will contact patient to schedule follow up.

## 2018-12-19 ENCOUNTER — Other Ambulatory Visit: Payer: Self-pay | Admitting: Oncology

## 2018-12-19 ENCOUNTER — Other Ambulatory Visit: Payer: Self-pay | Admitting: Adult Health

## 2018-12-19 DIAGNOSIS — C50811 Malignant neoplasm of overlapping sites of right female breast: Secondary | ICD-10-CM

## 2018-12-19 DIAGNOSIS — Z170 Estrogen receptor positive status [ER+]: Secondary | ICD-10-CM

## 2018-12-20 ENCOUNTER — Ambulatory Visit: Payer: Medicare Other | Admitting: Oncology

## 2018-12-20 ENCOUNTER — Other Ambulatory Visit: Payer: Medicare Other

## 2018-12-27 ENCOUNTER — Ambulatory Visit
Admission: RE | Admit: 2018-12-27 | Discharge: 2018-12-27 | Disposition: A | Discharge status: Home / Self Care | Payer: Medicare Other | Source: Ambulatory Visit | Attending: Oncology | Admitting: Oncology

## 2018-12-27 ENCOUNTER — Other Ambulatory Visit: Payer: Self-pay

## 2018-12-27 DIAGNOSIS — Z1231 Encounter for screening mammogram for malignant neoplasm of breast: Secondary | ICD-10-CM

## 2019-01-03 NOTE — Progress Notes (Signed)
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  The patient continues on tamoxifen.  She generally does well with that and perhaps because of his low-dose venlafaxine she is taking she is having absolutely no hot flashes.  She is having a little bit of a vaginal discharge and currently thinks she has a yeast infection.  She last saw Dr. Skeet Latch on 06/17/2018 for follow up of her endometrial cancer. She is scheduled to see Dr. Berline Lopes for follow up on 01/14/2019.  Since her last visit, she underwent genetic counseling on 09/06/2018. This was negative but showed a variance of uncertain significance in AXIN2 and in PMS2.  She also underwent left screening mammogram on 12/27/2018 at [facility], which showed: breast density category C; no evidence of malignancy.   REVIEW OF SYSTEMS: Doris Maynard exercises by doing Silver sneakers on the TV.  She likes to walk but has not been doing that very much.  She and her family were especially careful over the holidays.  A detailed review of systems today was otherwise stable   HISTORY OF CURRENT ILLNESS: From the original intake note:  Doris Maynard had routine screening mammography in [facility] on 12/12/2016  showing a possible abnormality in the right breast. She underwent unilateral right diagnostic mammography with tomography and right breast ultrasonography in [facility] on 12/19/2016 showing: breast density category C. Suspicious irregular hypoechoic mass right breast 10 o'clock upper outer position 6 cm from the nipple measuring 1.2 x 0.6 x 0.7 cm, felt to correspond with focal area of distortion on mammography. Additional suspicious right breast mass 1 o'clock upper inner position 4 cm from the nipple measuring 0.9 x 0.7 x 0.7 cm. Multiple cortically thickened right axillary lymph nodes.   Accordingly on 01/01/2017 she proceeded to biopsy of the right breast masses along with lymph node sampling . The pathology from this procedure showed ([id number])  Right breast 10 o'clock position: Invasive mammary carcinoma grade II. At the 1 o'clock position: Invasive mammary carcinoma grade II. E-cadherin stain shows diffuse but weak reactivity amongst the cells, favoring lobular carcinoma. Right axillary lymph node: No evidence of malignancy. Estrogen receptor positive at 99%, progesterone receptor positive at 99%, both with strong positivity, HER-2 not amplified by immunohistochemistry (1+).  She also completed a bilateral breast MRI on 01/16/2017 showing: Biopsy-proven multicentric invasive carcinomas within the RIGHT breast at the 10 o'clock and 1 o'clock axes, measuring 3.3 cm and 2 cm respectively, with associated biopsy clip artifacts. Additional suspicious enhancing mass within the slightly inner RIGHT breast, at middle depth, measuring 5 mm, located approximately 1.5 cm medial to the biopsy-proven cancer at the 10 o'clock axis, suspected satellite lesion. Additional suspicious enhancing mass within the upper inner quadrant of the RIGHT breast, at anterior depth, 2-3 o'clock axis, measuring 7 mm. No evidence of malignancy within the LEFT breast.  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  The patient's subsequent history is as detailed below.   PAST MEDICAL HISTORY: Past Medical History:  Diagnosis Date  . Breast cancer, right breast ([place])    S/P mastectomy 02/05/2017. Tamoxifen 2019 - ____  . Endometrial cancer ([place]) 2016    hysterectomy  . Family history of colon cancer   . Hypertension   . Rosacea    mainly on face    PAST SURGICAL HISTORY: Past Surgical History:  Procedure Laterality Date  . BREAST BIOPSY  2007   core biopsy "think it was on the left breast"  . BREAST BIOPSY Right 12/2016  . DILATION AND CURETTAGE OF UTERUS  1975   S/P miscarriage  . MASTECTOMY COMPLETE / SIMPLE W/ SENTINEL NODE BIOPSY Right 02/05/2017   RIGHT SIMPLE MASTECTOMY WITH AXILLARY SENTINEL NODE BIOPSY  . ROBOTIC ASSISTED TOTAL HYSTERECTOMY WITH BILATERAL SALPINGO OOPHERECTOMY  04/2014   and pelvic lymph node dissection - [facility]  . SIMPLE MASTECTOMY WITH AXILLARY SENTINEL NODE BIOPSY Right 02/05/2017   Procedure: RIGHT SIMPLE MASTECTOMY WITH AXILLARY SENTINEL NODE BIOPSY;  Surgeon: Erroll Luna, MD;  Location: [place];  Service: General;  Laterality: Right;    FAMILY HISTORY Family History  Problem Relation Age of Onset  . Colon cancer Father 73    The patient's father died of colon cancer at age 83. Her mother died at age 38 with a neck condition. The patient had 1 brother who died at age 62 with  Dementia, and she has no sisters. She denies a family history of ovarian or  breast cancer.    GYNECOLOGIC HISTORY:  No LMP recorded. Patient has had a hysterectomy. 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 Her husband, Mikki Santee, is retired from the [employer]. She has 2 children, her oldest is daughter, Tressia Miners who is 51 and a Freight forwarder for [employer] in [place]. Bernadett's youngest son, Nicki Reaper age 74  is an attorney in [place], [place]. The patient has 2 grandchildren who both live in [place]. She goes to a [employer] in [place], [place]    ADVANCED DIRECTIVES: In the absence of any documentation to the contrary, the patient's spouse is their HCPOA.    HEALTH MAINTENANCE: Social History   Tobacco Use  . Smoking status: Never Smoker  . Smokeless tobacco: Never Used  Substance Use Topics  . Alcohol use: Yes    Comment: 02/05/2017 will drink wine once or twice a month  . Drug use: No     Colonoscopy: 10/30/2014  PAP: status post hysterectomy  Bone density:12/12/2016 T score -3.2 osteoporosis   Allergies  Allergen Reactions  . Clindamycin Diarrhea    ? C.DIFF. ?    Current Outpatient Medications  Medication Sig Dispense Refill  . Cholecalciferol (VITAMIN D3) 5000 units CAPS Take 5,000 Units by mouth  daily.    . doxycycline (PERIOSTAT) 20 MG tablet     . doxylamine, Sleep, (SLEEP AID) 25 MG tablet Take 25 mg by mouth at bedtime as needed for sleep.    Marland Kitchen lisinopril (PRINIVIL,ZESTRIL) 20 MG tablet Take 20 mg by mouth daily.    . metroNIDAZOLE (METROCREAM) 0.75 % cream Apply 1 application topically daily as needed (rosacea). Patient applies to both eyes as needed    . Polyethyl Glycol-Propyl Glycol (SYSTANE OP) Apply 1 drop to eye daily as needed (dry eyes).    . tamoxifen (NOLVADEX) 20 MG tablet Take 1 tablet by mouth once daily 90  tablet 0  . venlafaxine XR (EFFEXOR-XR) 37.5 MG 24 hr capsule TAKE 1 CAPSULE BY MOUTH ONCE DAILY WITH BREAKFAST 90 capsule 0   No current facility-administered medications for this visit.    OBJECTIVE: Middle-aged white woman who appears stated age  50:   01/04/19 1147  BP: (!) 147/71  Pulse: (!) 109  Resp: 18  Temp: 98.3 F (36.8 C)  SpO2: 100%     Body mass index is 27.38 kg/m.   Wt Readings from Last 3 Encounters:  01/04/19 159 lb 8 oz (72.3 kg)  06/17/18 162 lb 3.2 oz (73.6 kg)  12/17/17 161 lb 4.8 oz (73.2 kg)      ECOG FS:1 - Symptomatic but completely ambulatory  Sclerae unicteric, EOMs intact Wearing a mask No cervical or supraclavicular adenopathy Lungs no rales or rhonchi Heart regular rate and rhythm Abd soft, nontender, positive bowel sounds MSK no focal spinal tenderness, no upper extremity lymphedema Neuro: nonfocal, well oriented, appropriate affect Breasts: The right breast is undergone mastectomy, with no evidence of local recurrence.  The left breast is benign.  Both axillae are benign.   LAB RESULTS:  CMP     Component Value Date/Time   NA 141 01/04/2019 1053   K 4.1 01/04/2019 1053   CL 107 01/04/2019 1053   CO2 24 01/04/2019 1053   GLUCOSE 124 (H) 01/04/2019 1053   BUN 14 01/04/2019 1053   CREATININE 0.90 01/04/2019 1053   CALCIUM 8.8 (L) 01/04/2019 1053   PROT 7.0 01/04/2019 1053   ALBUMIN 3.9 01/04/2019 1053   AST 17 01/04/2019 1053   ALT 13 01/04/2019 1053   ALKPHOS 53 01/04/2019 1053   BILITOT 0.4 01/04/2019 1053   GFRNONAA >60 01/04/2019 1053   GFRAA >60 01/04/2019 1053    No results found for: TOTALPROTELP, ALBUMINELP, A1GS, A2GS, BETS, BETA2SER, GAMS, MSPIKE, SPEI  No results found for: KPAFRELGTCHN, LAMBDASER, KAPLAMBRATIO  Lab Results  Component Value Date   WBC 6.0 01/04/2019   NEUTROABS 3.7 01/04/2019   HGB 13.2 01/04/2019   HCT 40.8 01/04/2019   MCV 91.9 01/04/2019   PLT 236 01/04/2019   No results found  for: LABCA2  No components found for: [id number]  No results for input(s): INR in the last 168 hours.  No results found for: LABCA2  No results found for: WUJ811  No results found for: BJY782  No results found for: NFA213  No results found for: CA2729  No components found for: HGQUANT  No results found for: CEA1 / No results found for: CEA1   No results found for: AFPTUMOR  No results found for: CHROMOGRNA  No results found for: HGBA, HGBA2QUANT, HGBFQUANT, HGBSQUAN (Hemoglobinopathy evaluation)   No results found for: LDH  No results found for: IRON, TIBC, IRONPCTSAT (Iron and TIBC)  No results found for: FERRITIN  Urinalysis No results found for:  COLORURINE, APPEARANCEUR, LABSPEC, PHURINE, GLUCOSEU, HGBUR, BILIRUBINUR, KETONESUR, PROTEINUR, UROBILINOGEN, NITRITE, LEUKOCYTESUR   STUDIES: MM 3D SCREEN BREAST UNI LEFT  Result Date: 12/28/2018 CLINICAL DATA:  Screening. EXAM: DIGITAL SCREENING UNILATERAL LEFT MAMMOGRAM WITH CAD AND TOMO COMPARISON:  Previous exam(s). ACR Breast Density Category c: The breast tissue is heterogeneously dense, which may obscure small masses. FINDINGS: The patient has had a right mastectomy. There are no findings suspicious for malignancy. Images were processed with CAD. IMPRESSION: No mammographic evidence of malignancy. A result letter of this screening mammogram will be mailed directly to the patient. RECOMMENDATION: Screening mammogram in one year.  (Code:SM-L-39M) BI-RADS CATEGORY  1: Negative. Electronically Signed   By: Kristopher Oppenheim M.D.   On: 12/28/2018 12:45    ELIGIBLE FOR AVAILABLE RESEARCH PROTOCOL: no  ASSESSMENT: 73 y.[place]. [place], [place] Woman status post robot assisted abdominal hysterectomy bilateral salpingo-oophorectomy and bilateral pelvic lymph node dissection 04/13/2014 for a grade 1, stage Ib endometrial cancer; now with a history of breast cancer as follows:  (1) s/p Right breast overlapping sites biopsy x2 (different  quadrants) on 01/01/2017 for a clinical mT2 N0,  Stage IB invasive lobular carcinoma, grade 2,  estrogen and progesterone receptor positive, HER-2 not amplified  (a) breast MRI on 01/16/2017 shows 2 additional areas of concern in the right breast  (2) right mastectomy with sentinel lymph node sampling 02/05/2017 showed an mT2 N0, stage IB invasive lobular carcinoma, grade 2, with negative margins.  (Total of 4 lymph nodes removed  (3) The Oncotype DX score was 8, predicting a risk of outside the breast recurrence over the next 10 years of 3% if the patient's only systemic therapy is tamoxifen for 5 years.  It also predicts no significant benefit from chemotherapy.  (4) adjuvant radiation not indicated  (5) tamoxifen started 02/27/2017  (a) bone density at [facility] 12/12/2016 shows a T score of -3.2  (b) status post hysterectomy with bilateral salpingo-oophorectomy  (c) remote history of oral contraceptives with no complications  (6) genetics testing 09/06/2018  (a) No pathogenic variants identified. VUS in AXIN2 called c.1714G>A and VUS in PMS2 c.487T>C identified on the [facility] Common Hereditary Cancers Panel. The Common Hereditary Cancers Panel offered by [facility] includes sequencing and/or deletion duplication testing of the following 48 genes: APC, ATM, AXIN2, BARD1, BMPR1A, BRCA1, BRCA2, BRIP1, CDH1, CDKN2A (p14ARF), CDKN2A (p16INK4a), CKD4, CHEK2, CTNNA1, DICER1, EPCAM (Deletion/duplication testing only), GREM1 (promoter region deletion/duplication testing only), KIT, MEN1, MLH1, MSH2, MSH3, MSH6, MUTYH, NBN, NF1, NHTL1, PALB2, PDGFRA, PMS2, POLD1, POLE, PTEN, RAD50, RAD51C, RAD51D, RNF43, SDHB, SDHC, SDHD, SMAD4, SMARCA4. STK11, TP53, TSC1, TSC2, and VHL.  The following genes were evaluated for sequence changes only: SDHA and HOXB13 c.251G>A variant only. The report date is 10/20/2018.   PLAN: Doris Maynard is now almost 2 years out from definitive surgery for her breast cancer with no evidence of  disease recurrence.  This is very favorable.  She is tolerating tamoxifen well.  I do think the mild vaginal discharge patients get from tamoxifen can occasionally precipitate yeast infections.  I am writing for Diflucan for her.  She will take it daily until cleared.  We discussed her mammogram and she understands she still has density C breasts.  With time and likely with the help of tamoxifen that likely will drift down to a be over the next few years  She will see me again in a year from now.  She knows to call for any other issue that may develop before  the next visit.  Yaxiel Minnie, Virgie Dad, MD  01/04/19 12:03 PM Medical Oncology and Hematology [facility] [place], [place] [address] Tel. [phone]    Fax. [phone]   I, Wilburn Mylar, am acting as scribe for Dr. Virgie Dad. Tyshell Ramberg.  I, Lurline Del MD, have reviewed the above documentation for accuracy and completeness, and I agree with the above.

## 2019-01-04 ENCOUNTER — Inpatient Hospital Stay: Payer: Medicare Other | Admitting: Oncology

## 2019-01-04 ENCOUNTER — Other Ambulatory Visit: Payer: Self-pay

## 2019-01-04 ENCOUNTER — Inpatient Hospital Stay: Payer: Medicare Other | Attending: Oncology

## 2019-01-04 VITALS — BP 147/71 | HR 109 | Temp 98.3°F | Resp 18 | Ht 64.0 in | Wt 159.5 lb

## 2019-01-04 DIAGNOSIS — C50811 Malignant neoplasm of overlapping sites of right female breast: Secondary | ICD-10-CM

## 2019-01-04 DIAGNOSIS — C541 Malignant neoplasm of endometrium: Secondary | ICD-10-CM | POA: Insufficient documentation

## 2019-01-04 DIAGNOSIS — Z170 Estrogen receptor positive status [ER+]: Secondary | ICD-10-CM | POA: Insufficient documentation

## 2019-01-04 DIAGNOSIS — Z90722 Acquired absence of ovaries, bilateral: Secondary | ICD-10-CM | POA: Insufficient documentation

## 2019-01-04 DIAGNOSIS — Z9011 Acquired absence of right breast and nipple: Secondary | ICD-10-CM | POA: Diagnosis not present

## 2019-01-04 DIAGNOSIS — Z79810 Long term (current) use of selective estrogen receptor modulators (SERMs): Secondary | ICD-10-CM | POA: Insufficient documentation

## 2019-01-04 DIAGNOSIS — Z79899 Other long term (current) drug therapy: Secondary | ICD-10-CM | POA: Diagnosis not present

## 2019-01-04 DIAGNOSIS — Z90710 Acquired absence of both cervix and uterus: Secondary | ICD-10-CM | POA: Diagnosis not present

## 2019-01-04 LAB — COMPREHENSIVE METABOLIC PANEL
ALT: 13 U/L (ref 0–44)
AST: 17 U/L (ref 15–41)
Albumin: 3.9 g/dL (ref 3.5–5.0)
Alkaline Phosphatase: 53 U/L (ref 38–126)
Anion gap: 10 (ref 5–15)
BUN: 14 mg/dL (ref 8–23)
CO2: 24 mmol/L (ref 22–32)
Calcium: 8.8 mg/dL — ABNORMAL LOW (ref 8.9–10.3)
Chloride: 107 mmol/L (ref 98–111)
Creatinine, Ser: 0.9 mg/dL (ref 0.44–1.00)
GFR calc Af Amer: >60 mL/min (ref >60)
GFR calc non Af Amer: >60 mL/min (ref >60)
Glucose, Bld: 124 mg/dL — ABNORMAL HIGH (ref 70–99)
Potassium: 4.1 mmol/L (ref 3.5–5.1)
Sodium: 141 mmol/L (ref 135–145)
Total Bilirubin: 0.4 mg/dL (ref 0.3–1.2)
Total Protein: 7 g/dL (ref 6.5–8.1)

## 2019-01-04 LAB — CBC WITH DIFFERENTIAL/PLATELET
Abs Immature Granulocytes: 0.01 10*3/uL (ref 0.00–0.07)
Basophils Absolute: 0.1 10*3/uL (ref 0.0–0.1)
Basophils Relative: 1 %
Eosinophils Absolute: 0.1 10*3/uL (ref 0.0–0.5)
Eosinophils Relative: 2 %
HCT: 40.8 % (ref 36.0–46.0)
Hemoglobin: 13.2 g/dL (ref 12.0–15.0)
Immature Granulocytes: 0 %
Lymphocytes Relative: 28 %
Lymphs Abs: 1.7 10*3/uL (ref 0.7–4.0)
MCH: 29.7 pg (ref 26.0–34.0)
MCHC: 32.4 g/dL (ref 30.0–36.0)
MCV: 91.9 fL (ref 80.0–100.0)
Monocytes Absolute: 0.4 10*3/uL (ref 0.1–1.0)
Monocytes Relative: 7 %
Neutro Abs: 3.7 10*3/uL (ref 1.7–7.7)
Neutrophils Relative %: 62 %
Platelets: 236 10*3/uL (ref 150–400)
RBC: 4.44 MIL/uL (ref 3.87–5.11)
RDW: 11.9 % (ref 11.5–15.5)
WBC: 6 10*3/uL (ref 4.0–10.5)
nRBC: 0 % (ref 0.0–0.2)

## 2019-01-04 MED ORDER — FLUCONAZOLE 100 MG PO TABS: 100.0000 mg | ORAL_TABLET | Freq: Every day | ORAL | 0 refills | Status: DC

## 2019-01-04 MED ORDER — VENLAFAXINE HCL ER 37.5 MG PO CP24: 37.5000 mg | ORAL_CAPSULE | Freq: Every day | ORAL | 4 refills | Status: DC

## 2019-01-04 MED ORDER — TAMOXIFEN CITRATE 20 MG PO TABS: 20.0000 mg | ORAL_TABLET | Freq: Every day | ORAL | 4 refills | Status: DC

## 2019-01-05 ENCOUNTER — Telehealth: Payer: Self-pay | Admitting: Oncology

## 2019-01-05 NOTE — Telephone Encounter (Signed)
Scheduled appt per 12/29 los.  Sent a message to HIM pool to get a calendar mailed out. 

## 2019-01-12 NOTE — Progress Notes (Deleted)
Gynecologic Oncology Return Clinic Visit  01/13/18   Reason for Visit: ***  Treatment History: Oncology History  Malignant neoplasm of overlapping sites of right breast in female, estrogen receptor positive ([place])  01/01/2017 Initial Biopsy   s/p Right breast overlapping sites biopsy x2 (different quadrants) on 01/01/2017 for a clinical mT2 N0,  Stage IB invasive lobular carcinoma, grade 2,  estrogen and progesterone receptor positive, HER-2 not amplified             (a) breast MRI on 01/16/2017 shows 2 additional areas of concern in the right breast   02/05/2017 Surgery   right mastectomy with sentinel lymph node sampling showed an mT2 N0, stage IB invasive lobular carcinoma, grade 2, with negative margins.  (Total of 4 lymph nodes removed   02/05/2017 Oncotype testing   The Oncotype DX score was 8, predicting a risk of outside the breast recurrence over the next 10 years of 3% if the patient's only systemic therapy is tamoxifen for 5 years.  It also predicts no significant benefit from chemotherapy.   02/27/2017 -  Anti-estrogen oral therapy   tamoxifen started 02/27/2017             (a) bone density at [facility] 12/12/2016 shows a T score of -3.2             (b) status post hysterectomy with bilateral salpingo-oophorectomy             (c) remote history of oral contraceptives with no complications   78/24/2353 Cancer Staging   Staging form: Breast, AJCC 8th Edition - Pathologic: Stage IA (pT2(m), pN0, cM0, G2, ER+, PR+, HER2-) - Signed by Gardenia Phlegm, NP on 10/21/2017   Endometrial cancer ([place])  02/28/2014 Initial Biopsy   D&C - gr 2 EMCA   02/28/2014 Initial Diagnosis   Endometrial cancer ([place])   04/13/2014 Surgery   RA TLH/BSO and PLND with Dr. Claiborne Billings in [facility]   04/13/2014 Pathologic Stage   Stage IB, gr 1 endometrioid adenocarcinoma, no LVSI Unremarkable cervix, bilateral adnexa, and 6 right and 8 left negative pelvic lymph nodes.  Depth of invasion was 0.7 out  of 1.4 cm.  The tumor size was 2.3 cm     Interval History: ***  Past Medical/Surgical History: Past Medical History:  Diagnosis Date  . Breast cancer, right breast ([place])    S/P mastectomy 02/05/2017. Tamoxifen 2019 - ____  . Endometrial cancer ([place]) 2016    hysterectomy  . Family history of colon cancer   . Hypertension   . Rosacea    mainly on face    Past Surgical History:  Procedure Laterality Date  . BREAST BIOPSY  2007   core biopsy "think it was on the left breast"  . BREAST BIOPSY Right 12/2016  . DILATION AND CURETTAGE OF UTERUS  1975   S/P miscarriage  . MASTECTOMY COMPLETE / SIMPLE W/ SENTINEL NODE BIOPSY Right 02/05/2017   RIGHT SIMPLE MASTECTOMY WITH AXILLARY SENTINEL NODE BIOPSY  . ROBOTIC ASSISTED TOTAL HYSTERECTOMY WITH BILATERAL SALPINGO OOPHERECTOMY  04/2014   and pelvic lymph node dissection - [facility]  . SIMPLE MASTECTOMY WITH AXILLARY SENTINEL NODE BIOPSY Right 02/05/2017   Procedure: RIGHT SIMPLE MASTECTOMY WITH AXILLARY SENTINEL NODE BIOPSY;  Surgeon: Erroll Luna, MD;  Location: [place];  Service: General;  Laterality: Right;    Family History  Problem Relation Age of Onset  . Colon cancer Father 41    Social History   Socioeconomic History  .  Marital status: Married    Spouse name: Not on file  . Number of children: Not on file  . Years of education: Not on file  . Highest education level: Not on file  Occupational History  . Not on file  Tobacco Use  . Smoking status: Never Smoker  . Smokeless tobacco: Never Used  Substance and Sexual Activity  . Alcohol use: Yes    Comment: 02/05/2017 will drink wine once or twice a month  . Drug use: No  . Sexual activity: Not Currently  Other Topics Concern  . Not on file  Social History Narrative  . Not on file   Social Determinants of Health   Financial Resource Strain:   . Difficulty of Paying Living Expenses: Not on file  Food Insecurity:   . Worried About Charity fundraiser in the  Last Year: Not on file  . Ran Out of Food in the Last Year: Not on file  Transportation Needs:   . Lack of Transportation (Medical): Not on file  . Lack of Transportation (Non-Medical): Not on file  Physical Activity:   . Days of Exercise per Week: Not on file  . Minutes of Exercise per Session: Not on file  Stress:   . Feeling of Stress : Not on file  Social Connections:   . Frequency of Communication with Friends and Family: Not on file  . Frequency of Social Gatherings with Friends and Family: Not on file  . Attends Religious Services: Not on file  . Active Member of Clubs or Organizations: Not on file  . Attends Archivist Meetings: Not on file  . Marital Status: Not on file    Current Medications:  Current Outpatient Medications:  .  Cholecalciferol (VITAMIN D3) 5000 units CAPS, Take 5,000 Units by mouth daily., Disp: , Rfl:  .  doxycycline (PERIOSTAT) 20 MG tablet, , Disp: , Rfl:  .  doxylamine, Sleep, (SLEEP AID) 25 MG tablet, Take 25 mg by mouth at bedtime as needed for sleep., Disp: , Rfl:  .  fluconazole (DIFLUCAN) 100 MG tablet, Take 1 tablet (100 mg total) by mouth daily., Disp: 10 tablet, Rfl: 0 .  lisinopril (PRINIVIL,ZESTRIL) 20 MG tablet, Take 20 mg by mouth daily., Disp: , Rfl:  .  metroNIDAZOLE (METROCREAM) 0.75 % cream, Apply 1 application topically daily as needed (rosacea). Patient applies to both eyes as needed, Disp: , Rfl:  .  Polyethyl Glycol-Propyl Glycol (SYSTANE OP), Apply 1 drop to eye daily as needed (dry eyes)., Disp: , Rfl:  .  tamoxifen (NOLVADEX) 20 MG tablet, Take 1 tablet (20 mg total) by mouth daily., Disp: 90 tablet, Rfl: 4 .  venlafaxine XR (EFFEXOR-XR) 37.5 MG 24 hr capsule, Take 1 capsule (37.5 mg total) by mouth daily with breakfast., Disp: 90 capsule, Rfl: 4  Review of Symptoms: Complete 10-system review is negative except ***as above in Interval History.  Physical Exam: There were no vitals taken for this visit. General:  ***Alert, oriented, no acute distress. HEENT: ***Posterior oropharynx clear, sclera anicteric. Chest: ***Clear to auscultation bilaterally.  ***Port site clean. Cardiovascular: ***Regular rate and rhythm, no murmurs. Abdomen: ***Obese, soft, nontender.  Normoactive bowel sounds.  No masses or hepatosplenomegaly appreciated.  ***Well-healed scar. Extremities: ***Grossly normal range of motion.  Warm, well perfused.  No edema bilaterally. Skin: ***No rashes or lesions noted. Lymphatics: ***No cervical, supraclavicular, or inguinal adenopathy. GU: Normal appearing external genitalia without erythema, excoriation, or lesions.  Speculum exam reveals ***.  Bimanual  exam reveals ***.  ***Rectovaginal exam  confirms ___.  Laboratory & Radiologic Studies: none  Assessment & Plan: Doris Maynard is a 74 y.o. woman with a history of stage IB gr 1 EMCA.  ***   Jeral Pinch, MD  Division of Gynecologic Oncology  Department of Obstetrics and Gynecology  [facility]

## 2019-01-14 ENCOUNTER — Ambulatory Visit: Payer: Medicare Other | Admitting: Gynecologic Oncology

## 2019-01-17 NOTE — Progress Notes (Signed)
Gynecologic Oncology Return Clinic Visit  01/19/19   Reason for Visit: surveillance visit in the setting of a history of endometrial cancer  Treatment History: Oncology History  Malignant neoplasm of overlapping sites of right breast in female, estrogen receptor positive (HCC)  01/01/2017 Initial Biopsy   s/p Right breast overlapping sites biopsy x2 (different quadrants) on 01/01/2017 for a clinical mT2 N0,  Stage IB invasive lobular carcinoma, grade 2,  estrogen and progesterone receptor positive, HER-2 not amplified             (a) breast MRI on 01/16/2017 shows 2 additional areas of concern in the right breast   02/05/2017 Surgery   right mastectomy with sentinel lymph node sampling showed an mT2 N0, stage IB invasive lobular carcinoma, grade 2, with negative margins.  (Total of 4 lymph nodes removed   02/05/2017 Oncotype testing   The Oncotype DX score was 8, predicting a risk of outside the breast recurrence over the next 10 years of 3% if the patient's only systemic therapy is tamoxifen for 5 years.  It also predicts no significant benefit from chemotherapy.   02/27/2017 -  Anti-estrogen oral therapy   tamoxifen started 02/27/2017             (a) bone density at [facility] 12/12/2016 shows a T score of -3.2             (b) status post hysterectomy with bilateral salpingo-oophorectomy             (c) remote history of oral contraceptives with no complications   10/21/2017 Cancer Staging   Staging form: Breast, AJCC 8th Edition - Pathologic: Stage IA (pT2(m), pN0, cM0, G2, ER+, PR+, HER2-) - Signed by Causey, Lindsey Cornetto, NP on 10/21/2017   Endometrial cancer (HCC)  02/28/2014 Initial Biopsy   D&C - gr 2 EMCA   02/28/2014 Initial Diagnosis   Endometrial cancer (HCC)   04/13/2014 Surgery   RA TLH/BSO and PLND with Dr. 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  Depth of invasion was 0.7 out of 1.4 cm.  The tumor size was 2.3 cm     Interval History: Last seen by Dr. Brewster on 06/17/18. Has been on anti-estrogen therapy for her breast cancer (Tamoxifen) since 02/2017.  She reports overall doing well today.  She endorses a good appetite without nausea or emesis.  She endorses normal bowel and bladder function.  She denies any abdominal pain, bloating or early satiety.  She denies any vaginal bleeding.  She endorses some occasional white discharge and denies any associated pruritus, burning, odor, or other symptoms.  She was prescribed fluconazole by Dr. Magrinat when she saw him at the end of December secondary to this white discharge and thinking she might have a yeast infection.  She did not notice much change to the discharge.  She describes it as being only occasional and not bothersome.  Past Medical/Surgical History: Past Medical History:  Diagnosis Date  . Breast cancer, right breast (HCC)    S/P mastectomy 02/05/2017. Tamoxifen 2019 - ____  . Endometrial cancer (HCC) 2016    hysterectomy  . Family history of colon cancer   . Hypertension   . Rosacea    mainly on face    Past Surgical History:  Procedure Laterality Date  . BREAST BIOPSY  2007     core biopsy "think it was on the left breast"  . BREAST BIOPSY Right 12/2016  . DILATION AND CURETTAGE OF UTERUS  1975   S/P miscarriage  . MASTECTOMY COMPLETE / SIMPLE W/ SENTINEL NODE BIOPSY Right 02/05/2017   RIGHT SIMPLE MASTECTOMY WITH AXILLARY SENTINEL NODE BIOPSY  . ROBOTIC ASSISTED TOTAL HYSTERECTOMY WITH BILATERAL SALPINGO OOPHERECTOMY  04/2014   and pelvic lymph node dissection - [facility]  . SIMPLE MASTECTOMY WITH AXILLARY SENTINEL NODE BIOPSY Right 02/05/2017   Procedure: RIGHT SIMPLE MASTECTOMY WITH AXILLARY SENTINEL NODE BIOPSY;  Surgeon: Cornett, Thomas, MD;  Location: [facility] OR;  Service: General;  Laterality: Right;    Family History   Problem Relation Age of Onset  . Colon cancer Father 94    Social History   Socioeconomic History  . Marital status: Married    Spouse name: Not on file  . Number of children: Not on file  . Years of education: Not on file  . Highest education level: Not on file  Occupational History  . Not on file  Tobacco Use  . Smoking status: Never Smoker  . Smokeless tobacco: Never Used  Substance and Sexual Activity  . Alcohol use: Yes    Comment: 02/05/2017 will drink wine once or twice a month  . Drug use: No  . Sexual activity: Not Currently  Other Topics Concern  . Not on file  Social History Narrative  . Not on file   Social Determinants of Health   Financial Resource Strain:   . Difficulty of Paying Living Expenses: Not on file  Food Insecurity:   . Worried About Running Out of Food in the Last Year: Not on file  . Ran Out of Food in the Last Year: Not on file  Transportation Needs:   . Lack of Transportation (Medical): Not on file  . Lack of Transportation (Non-Medical): Not on file  Physical Activity:   . Days of Exercise per Week: Not on file  . Minutes of Exercise per Session: Not on file  Stress:   . Feeling of Stress : Not on file  Social Connections:   . Frequency of Communication with Friends and Family: Not on file  . Frequency of Social Gatherings with Friends and Family: Not on file  . Attends Religious Services: Not on file  . Active Member of Clubs or Organizations: Not on file  . Attends Club or Organization Meetings: Not on file  . Marital Status: Not on file    Current Medications:  Current Outpatient Medications:  .  Cholecalciferol (VITAMIN D3) 5000 units CAPS, Take 5,000 Units by mouth daily., Disp: , Rfl:  .  doxylamine, Sleep, (SLEEP AID) 25 MG tablet, Take 25 mg by mouth at bedtime as needed for sleep., Disp: , Rfl:  .  fluconazole (DIFLUCAN) 100 MG tablet, Take 1 tablet (100 mg total) by mouth daily., Disp: 10 tablet, Rfl: 0 .  lisinopril  (PRINIVIL,ZESTRIL) 20 MG tablet, Take 20 mg by mouth daily., Disp: , Rfl:  .  metroNIDAZOLE (METROCREAM) 0.75 % cream, Apply 1 application topically daily as needed (rosacea). Patient applies to both eyes as needed, Disp: , Rfl:  .  Polyethyl Glycol-Propyl Glycol (SYSTANE OP), Apply 1 drop to eye daily as needed (dry eyes)., Disp: , Rfl:  .  tamoxifen (NOLVADEX) 20 MG tablet, Take 1 tablet (20 mg total) by mouth daily., Disp: 90 tablet, Rfl: 4 .  venlafaxine XR (EFFEXOR-XR) 37.5 MG 24 hr capsule, Take 1 capsule (37.5 mg total)   by mouth daily with breakfast., Disp: 90 capsule, Rfl: 4  Review of Symptoms: Denies appetite changes, fevers, chills, fatigue, unexplained weight changes. Denies hearing loss, neck lumps or masses, mouth sores, ringing in ears or voice changes. Denies cough or wheezing.  Denies shortness of breath. Denies chest pain or palpitations. Denies leg swelling. Denies abdominal distention, pain, blood in stools, constipation, diarrhea, nausea, vomiting, or early satiety. Denies pain with intercourse, dysuria, frequency, hematuria or incontinence. Denies hot flashes, pelvic pain, vaginal bleeding.   Denies joint pain, back pain or muscle pain/cramps. Denies itching, rash, or wounds. Denies dizziness, headaches, numbness or seizures. Denies swollen lymph nodes or glands, denies easy bruising or bleeding. Denies anxiety, depression, confusion, or decreased concentration.  Physical Exam: BP (!) 163/74 (BP Location: Left Arm, Patient Position: Sitting)   Pulse (!) 113   Temp 97.9 F (36.6 C) (Temporal)   Resp 16   Ht 5' 4" (1.626 m)   Wt 158 lb (71.7 kg)   SpO2 (!) 0%   BMI 27.12 kg/m  General: Alert, oriented, no acute distress. HEENT: Atraumatic, normocephalic, sclera anicteric. Chest: Clear to auscultation bilaterally.  No wheezes, rhonchi, or rales. Cardiovascular: Regular rate and rhythm, no murmurs. Abdomen: soft, nontender.  Normoactive bowel sounds.  No masses or  hepatosplenomegaly appreciated.  Well-healed trocar incisions. Extremities: Grossly normal range of motion.  Warm, well perfused.  No edema bilaterally. Skin: No rashes or lesions noted. Lymphatics: No cervical, supraclavicular, or inguinal adenopathy. GU: Normal appearing external genitalia without erythema, excoriation, or lesions.  Speculum exam reveals moderate vaginal atrophy, small amount of physiologic appearing discharge.  No odor.  Bimanual exam reveals cuff intact, no masses or nodularity.  Rectovaginal exam  confirms confirms these findings as well as the presence of a rectocele.  Laboratory & Radiologic Studies: none  Assessment & Plan: MANAIA SAMAD is a 74 y.o. woman with a history of stage IB gr 1 EMCA.  The patient continues to be without evidence of disease.  She has occasional, nonbothersome vaginal discharge.  We discussed possibility of testing for yeast or other source of the discharge although I suspect it may be from atrophy.  If she has increase in the quantity of discharge or develops other symptoms, I have asked her to let me know.  Next month, the patient will be 5 years out from treatment.  We discussed that she has now graduated to the next level of surveillance.  She can either continue to follow in our clinic with one of our providers or see a gynecologist yearly.  Her preference is to stay in our clinic.  I have asked her to call later this year to make a follow-up for early 2022.  We discussed the signs and symptoms concerning for disease recurrence and she knows to call the clinic if she develops any of these prior to her next visit.  22 minutes of total time was spent for this patient encounter, including preparation, face-to-face counseling with the patient and coordination of care, and documentation of the encounter.  Jeral Pinch, MD  Division of Gynecologic Oncology  Department of Obstetrics and Gynecology  [facility]

## 2019-01-19 ENCOUNTER — Other Ambulatory Visit: Payer: Self-pay

## 2019-01-19 ENCOUNTER — Inpatient Hospital Stay: Payer: Medicare PPO | Attending: Oncology | Admitting: Gynecologic Oncology

## 2019-01-19 ENCOUNTER — Encounter: Payer: Self-pay | Admitting: Gynecologic Oncology

## 2019-01-19 VITALS — BP 163/74 | HR 113 | Temp 97.9°F | Resp 16 | Ht 64.0 in | Wt 158.0 lb

## 2019-01-19 DIAGNOSIS — Z9011 Acquired absence of right breast and nipple: Secondary | ICD-10-CM | POA: Diagnosis not present

## 2019-01-19 DIAGNOSIS — Z79810 Long term (current) use of selective estrogen receptor modulators (SERMs): Secondary | ICD-10-CM | POA: Diagnosis not present

## 2019-01-19 DIAGNOSIS — C50811 Malignant neoplasm of overlapping sites of right female breast: Secondary | ICD-10-CM | POA: Diagnosis not present

## 2019-01-19 DIAGNOSIS — Z90710 Acquired absence of both cervix and uterus: Secondary | ICD-10-CM | POA: Diagnosis not present

## 2019-01-19 DIAGNOSIS — Z8542 Personal history of malignant neoplasm of other parts of uterus: Secondary | ICD-10-CM

## 2019-01-19 DIAGNOSIS — Z08 Encounter for follow-up examination after completed treatment for malignant neoplasm: Secondary | ICD-10-CM | POA: Diagnosis not present

## 2019-01-19 DIAGNOSIS — Z79899 Other long term (current) drug therapy: Secondary | ICD-10-CM | POA: Diagnosis not present

## 2019-01-19 DIAGNOSIS — N898 Other specified noninflammatory disorders of vagina: Secondary | ICD-10-CM | POA: Insufficient documentation

## 2019-01-19 DIAGNOSIS — I10 Essential (primary) hypertension: Secondary | ICD-10-CM | POA: Diagnosis not present

## 2019-01-19 DIAGNOSIS — Z170 Estrogen receptor positive status [ER+]: Secondary | ICD-10-CM | POA: Diagnosis not present

## 2019-01-19 DIAGNOSIS — C541 Malignant neoplasm of endometrium: Secondary | ICD-10-CM

## 2019-01-19 DIAGNOSIS — Z90722 Acquired absence of ovaries, bilateral: Secondary | ICD-10-CM | POA: Diagnosis not present

## 2019-01-19 NOTE — Patient Instructions (Signed)
It was a pleasure meeting you today.  Please call our clinic towards the end of 2021 to schedule a visit to come back in a year.  If you develop any vaginal bleeding or other new symptoms prior to that time, please call our clinic at [phone].

## 2019-08-26 ENCOUNTER — Other Ambulatory Visit: Payer: Self-pay | Admitting: Oncology

## 2019-08-26 DIAGNOSIS — Z1231 Encounter for screening mammogram for malignant neoplasm of breast: Secondary | ICD-10-CM

## 2019-12-28 ENCOUNTER — Other Ambulatory Visit: Payer: Self-pay

## 2019-12-28 ENCOUNTER — Ambulatory Visit
Admission: RE | Admit: 2019-12-28 | Discharge: 2019-12-28 | Disposition: A | Discharge status: Home / Self Care | Payer: Medicare PPO | Source: Ambulatory Visit | Attending: Oncology | Admitting: Oncology

## 2019-12-28 DIAGNOSIS — Z1231 Encounter for screening mammogram for malignant neoplasm of breast: Secondary | ICD-10-CM

## 2019-12-29 ENCOUNTER — Ambulatory Visit: Payer: Medicare PPO

## 2020-01-03 ENCOUNTER — Ambulatory Visit: Payer: Medicare Other | Admitting: Oncology

## 2020-01-03 ENCOUNTER — Other Ambulatory Visit: Payer: Medicare Other

## 2020-01-09 NOTE — Progress Notes (Signed)
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  She continues on tamoxifen.  Hot flashes are not an issue.  She has a mild vaginal discharge at times and she uses a pad for that  She last saw Dr. Berline Lopes on 01/14/2019 for follow up of her endometrial cancer. There was no evidence of disease recurrence.  Since her last visit, she underwent left screening mammography with tomography at [facility] on 12/28/2019 showing: breast density category C; no evidence of malignancy.   Of note, she is scheduled for colonoscopy on 02/13/2020 with Dr. Marin Comment at [facility].   REVIEW OF SYSTEMS: Ricca exercises by walking about 30 minutes about twice a week.  She also occasionally does virtual Silver sneakers, 15 minutes at a time.  A detailed review of systems today was otherwise noncontributory   COVID 19 VACCINATION STATUS: Status post Moderna x2 with booster December 2021   HISTORY OF CURRENT ILLNESS: From the original intake note:  Amandine had routine screening mammography in [facility] on 12/12/2016 showing a possible abnormality in the right breast. She underwent unilateral right diagnostic mammography with tomography and right breast ultrasonography in [facility] on 12/19/2016 showing: breast density category C. Suspicious irregular hypoechoic mass right breast 10 o'clock upper outer position 6 cm from the nipple measuring 1.2 x 0.6 x 0.7 cm, felt to correspond with focal area of distortion on mammography. Additional suspicious right breast mass 1 o'clock upper inner position 4 cm from the nipple measuring 0.9 x 0.7 x 0.7 cm. Multiple cortically thickened right axillary lymph nodes.   Accordingly on 01/01/2017 she proceeded to biopsy of the right breast masses along with lymph node sampling . The pathology from this procedure showed ([id number])  Right breast 10 o'clock position: Invasive mammary carcinoma grade II. At the 1 o'clock position: Invasive mammary carcinoma grade II. E-cadherin stain shows diffuse but weak reactivity amongst the cells, favoring lobular carcinoma. Right axillary lymph node: No evidence of malignancy. Estrogen receptor positive at 99%, progesterone receptor positive at 99%, both with strong positivity, HER-2 not amplified by immunohistochemistry (1+).  She also completed a bilateral breast MRI on 01/16/2017 showing: Biopsy-proven multicentric invasive carcinomas within the RIGHT breast at the 10 o'clock and 1 o'clock axes, measuring 3.3 cm and 2 cm respectively, with associated biopsy clip artifacts. Additional suspicious enhancing mass within the slightly inner RIGHT breast, at middle depth, measuring 5 mm, located approximately 1.5 cm medial to the biopsy-proven cancer at the 10 o'clock axis, suspected satellite lesion. Additional suspicious enhancing mass within the upper inner quadrant of the RIGHT breast, at anterior depth, 2-3 o'clock axis, measuring 7 mm. No evidence of malignancy within the LEFT breast.  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  The patient's subsequent history is as detailed below.   PAST MEDICAL HISTORY: Past Medical History:  Diagnosis Date  . Breast cancer, right breast ([place])    S/P mastectomy 02/05/2017. Tamoxifen 2019 - ____  . Endometrial cancer (May) 2016    hysterectomy  . Family history of colon cancer   . Hypertension   . Rosacea    mainly on face    PAST SURGICAL HISTORY: Past Surgical History:  Procedure Laterality Date  . BREAST BIOPSY  2007   core biopsy "think it was on the left breast"  . BREAST BIOPSY Right 12/2016  . DILATION AND CURETTAGE OF UTERUS  1975   S/P miscarriage  . MASTECTOMY COMPLETE / SIMPLE W/ SENTINEL NODE BIOPSY Right 02/05/2017   RIGHT SIMPLE MASTECTOMY WITH AXILLARY SENTINEL NODE BIOPSY  . ROBOTIC ASSISTED TOTAL HYSTERECTOMY WITH BILATERAL SALPINGO OOPHERECTOMY  04/2014   and pelvic lymph node dissection - [facility]  . SIMPLE MASTECTOMY WITH AXILLARY SENTINEL NODE BIOPSY Right 02/05/2017   Procedure: RIGHT SIMPLE MASTECTOMY WITH AXILLARY SENTINEL NODE BIOPSY;  Surgeon: Erroll Luna, MD;  Location: [facility];  Service: General;  Laterality: Right;    FAMILY HISTORY Family History  Problem Relation Age of Onset  . Colon cancer Father 31   The patient's father died of colon cancer at age 16. Her mother died at age 24 with a neck condition. The patient had 1 brother who died at age 61 with dementia, and she has no sisters. She denies a family history of ovarian or breast cancer.    GYNECOLOGIC HISTORY:  No LMP recorded. Patient has had a hysterectomy. 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 Her husband, Mikki Santee, is retired from the [employer]. She has 2 children, her oldest is daughter, Tressia Miners who is 66 and a Freight forwarder for [employer] in [place]. Stepanie's youngest son, Nicki Reaper age 10  is an attorney in [place], [place]. The patient has 2 grandchildren who both live in [place]. She goes to a [employer] in [place], [place]    ADVANCED DIRECTIVES: In the absence of any documentation to the contrary, the patient's spouse is their HCPOA.    HEALTH MAINTENANCE: Social History   Tobacco Use  . Smoking status: Never Smoker  . Smokeless tobacco: Never Used  Vaping Use  . Vaping Use: Never used  Substance Use Topics  . Alcohol use: Yes    Comment: 02/05/2017 will drink wine once or twice a month  . Drug use: No     Colonoscopy: 10/30/2014  PAP: status post hysterectomy  Bone density: 12/12/2016, -3.2 (osteoporosis)   Allergies  Allergen Reactions  . Clindamycin Diarrhea    ? C.DIFF. ?    Current Outpatient Medications  Medication Sig Dispense Refill  . Cholecalciferol (VITAMIN D3) 5000 units CAPS Take 5,000 Units by mouth daily.    Marland Kitchen doxylamine, Sleep, (SLEEP AID) 25 MG tablet Take 25 mg by mouth at bedtime as needed  for sleep.    . fluconazole (DIFLUCAN) 100 MG tablet Take 1 tablet (100 mg total) by mouth daily. 10 tablet 0  . lisinopril (PRINIVIL,ZESTRIL) 20 MG tablet Take 20 mg by mouth daily.    . metroNIDAZOLE (METROCREAM) 0.75 % cream Apply 1 application topically daily as needed (rosacea). Patient applies to both eyes as needed    . Polyethyl Glycol-Propyl Glycol (SYSTANE OP) Apply 1 drop to eye daily as needed (dry eyes).    . tamoxifen (NOLVADEX) 20 MG tablet Take 1 tablet (20 mg total) by mouth daily. 90 tablet 4  . venlafaxine XR (EFFEXOR-XR) 37.5 MG 24 hr  capsule Take 1 capsule (37.5 mg total) by mouth daily with breakfast. 90 capsule 4   No current facility-administered medications for this visit.    OBJECTIVE: White woman in no acute distress  Vitals:   01/10/20 0920  BP: (!) 155/77  Pulse: (!) 106  Resp: 18  Temp: 98.4 F (36.9 C)  SpO2: 100%     Body mass index is 26.16 kg/m.   Wt Readings from Last 3 Encounters:  01/10/20 152 lb 6.4 oz (69.1 kg)  01/19/19 158 lb (71.7 kg)  01/04/19 159 lb 8 oz (72.3 kg)      ECOG FS:1 - Symptomatic but completely ambulatory  Sclerae unicteric, EOMs intact Wearing a mask No cervical or supraclavicular adenopathy Lungs no rales or rhonchi Heart regular rate and rhythm Abd soft, nontender, positive bowel sounds MSK no focal spinal tenderness, no upper extremity lymphedema Neuro: nonfocal, well oriented, appropriate affect Breasts: The right breast is status post mastectomy.  There is no evidence of chest wall recurrence.  The left breast is benign.  Both axillae are benign.   LAB RESULTS:  CMP     Component Value Date/Time   NA 140 01/10/2020 0905   K 4.0 01/10/2020 0905   CL 105 01/10/2020 0905   CO2 25 01/10/2020 0905   GLUCOSE 102 (H) 01/10/2020 0905   BUN 13 01/10/2020 0905   CREATININE 0.97 01/10/2020 0905   CALCIUM 9.2 01/10/2020 0905   PROT 7.2 01/10/2020 0905   ALBUMIN 3.9 01/10/2020 0905   AST 19 01/10/2020 0905   ALT 17 01/10/2020 0905   ALKPHOS 46 01/10/2020 0905   BILITOT 0.6 01/10/2020 0905   GFRNONAA >60 01/10/2020 0905   GFRAA >60 01/04/2019 1053    No results found for: TOTALPROTELP, ALBUMINELP, A1GS, A2GS, BETS, BETA2SER, GAMS, MSPIKE, SPEI  No results found for: KPAFRELGTCHN, LAMBDASER, KAPLAMBRATIO  Lab Results  Component Value Date   WBC 5.0 01/10/2020   NEUTROABS 3.0 01/10/2020   HGB 13.0 01/10/2020   HCT 40.1 01/10/2020   MCV 91.1 01/10/2020   PLT 222 01/10/2020   No results found for: LABCA2  No components found for: TLXBWI203  No  results for input(s): INR in the last 168 hours.  No results found for: LABCA2  No results found for: TDH741  No results found for: ULA453  No results found for: MIW803  No results found for: CA2729  No components found for: HGQUANT  No results found for: CEA1 / No results found for: CEA1   No results found for: AFPTUMOR  No results found for: CHROMOGRNA  No results found for: HGBA, HGBA2QUANT, HGBFQUANT, HGBSQUAN (Hemoglobinopathy evaluation)   No results found for: LDH  No results found for: IRON, TIBC, IRONPCTSAT (Iron and TIBC)  No results found for: FERRITIN  Urinalysis No results found for: COLORURINE, APPEARANCEUR, LABSPEC, [place], GLUCOSEU, [place], BILIRUBINUR, KETONESUR,  PROTEINUR, UROBILINOGEN, NITRITE, LEUKOCYTESUR   STUDIES: MM 3D SCREEN BREAST UNI LEFT  Result Date: 01/02/2020 CLINICAL DATA:  Screening. EXAM: DIGITAL SCREENING UNILATERAL LEFT MAMMOGRAM WITH CAD AND TOMO COMPARISON:  Previous exam(s). ACR Breast Density Category c: The breast tissue is heterogeneously dense, which may obscure small masses. FINDINGS: There are no findings suspicious for malignancy. Images were processed with CAD. IMPRESSION: No mammographic evidence of malignancy. A result letter of this screening mammogram will be mailed directly to the patient. RECOMMENDATION: Screening mammogram in one year. (Code:SM-B-01Y) BI-RADS CATEGORY  1: Negative. Electronically Signed   By: Lovey Newcomer M.D.   On: 01/02/2020 08:35    ELIGIBLE FOR AVAILABLE RESEARCH PROTOCOL: no  ASSESSMENT: 75 y.o. [place], [place] Woman status post robot assisted abdominal hysterectomy bilateral salpingo-oophorectomy and bilateral pelvic lymph node dissection 04/13/2014 for a grade 1, stage Ib endometrial cancer; with a history of subsequent breast cancer as follows:  (1) s/p Right breast overlapping sites biopsy x2 (different quadrants) on 01/01/2017 for a clinical mT2 N0,  Stage IB invasive lobular carcinoma, grade  2,  estrogen and progesterone receptor positive, HER-2 not amplified  (a) breast MRI on 01/16/2017 shows 2 additional areas of concern in the right breast  (2) right mastectomy with sentinel lymph node sampling 02/05/2017 showed an mT2 N0, stage IB invasive lobular carcinoma, grade 2, with negative margins.   (a)  A total of 4 right axillary lymph nodes removed  (3) The Oncotype DX score was 8, predicting a risk of outside the breast recurrence over the next 10 years of 3% if the patient's only systemic therapy is tamoxifen for 5 years.  It also predicts no significant benefit from chemotherapy.  (4) adjuvant radiation not indicated  (5) tamoxifen started 02/27/2017  (a) [facility] density at [facility] 12/12/2016 shows a T score of -3.2  (b) status post hysterectomy with bilateral salpingo-oophorectomy  (c) remote history of oral contraceptives with no complications  (6) genetics testing 10/20/2018 through the [facility] Common Hereditary Cancers Panel found no deleterious mutations in  APC, ATM, AXIN2, BARD1, BMPR1A, BRCA1, BRCA2, BRIP1, CDH1, CDKN2A (p14ARF), CDKN2A (p16INK4a), CKD4, CHEK2, CTNNA1, DICER1, EPCAM (Deletion/duplication testing only), GREM1 (promoter region deletion/duplication testing only), KIT, MEN1, MLH1, MSH2, MSH3, MSH6, MUTYH, NBN, NF1, NHTL1, PALB2, PDGFRA, PMS2, POLD1, POLE, PTEN, RAD50, RAD51C, RAD51D, RNF43, SDHB, SDHC, SDHD, SMAD4, SMARCA4. STK11, TP53, TSC1, TSC2, and VHL.  The following genes were evaluated for sequence changes only: SDHA and HOXB13 c.251G>A variant only. The report date is 10/20/2018.  (a) VUS in [place] called c.1714G>A and VUS in PMS2 c.487T>C identified    PLAN: Roux is now 3 years out from definitive surgery for breast cancer with no evidence of disease recurrence.  This is favorable.  She is tolerating tamoxifen well.  We discussed that for lobular breast cancers my recommendation is tamoxifen for a full 10 years as these cancers can be very  slow-growing and very difficult to detect.  Her breast density is still C.  This should decrease with time and also with continuing tamoxifen  She will see [place] again in 1 year.  She knows to call for any other issue that may develop before the next visit  Total encounter time 25 minutes.*  Barret Esquivel, Virgie Dad, MD  01/10/20 9:41 AM Medical Oncology and Hematology [facility] [place], [place] [address] Tel. [phone]    Fax. [phone]   I, Wilburn Mylar, am acting as scribe for Dr. Virgie Dad. Claudina Oliphant.  I, Lurline Del MD, have reviewed the above documentation for accuracy and completeness, and I agree with the above.   *Total Encounter Time as defined by the Centers for Medicare and Medicaid Services includes, in addition to the face-to-face time of a patient visit (documented in the note above) non-face-to-face time: obtaining and reviewing outside history, ordering and reviewing medications, tests or procedures, care coordination (communications with other health care professionals or caregivers) and documentation in the medical record.

## 2020-01-10 ENCOUNTER — Other Ambulatory Visit: Payer: Self-pay

## 2020-01-10 ENCOUNTER — Inpatient Hospital Stay: Payer: Medicare PPO | Attending: Oncology

## 2020-01-10 ENCOUNTER — Inpatient Hospital Stay: Payer: Medicare PPO | Admitting: Oncology

## 2020-01-10 VITALS — BP 155/77 | HR 106 | Temp 98.4°F | Resp 18 | Ht 64.0 in | Wt 152.4 lb

## 2020-01-10 DIAGNOSIS — Z90710 Acquired absence of both cervix and uterus: Secondary | ICD-10-CM | POA: Diagnosis not present

## 2020-01-10 DIAGNOSIS — C541 Malignant neoplasm of endometrium: Secondary | ICD-10-CM | POA: Diagnosis not present

## 2020-01-10 DIAGNOSIS — C50811 Malignant neoplasm of overlapping sites of right female breast: Secondary | ICD-10-CM

## 2020-01-10 DIAGNOSIS — Z170 Estrogen receptor positive status [ER+]: Secondary | ICD-10-CM | POA: Insufficient documentation

## 2020-01-10 DIAGNOSIS — Z79810 Long term (current) use of selective estrogen receptor modulators (SERMs): Secondary | ICD-10-CM | POA: Insufficient documentation

## 2020-01-10 DIAGNOSIS — Z8542 Personal history of malignant neoplasm of other parts of uterus: Secondary | ICD-10-CM | POA: Insufficient documentation

## 2020-01-10 DIAGNOSIS — Z9011 Acquired absence of right breast and nipple: Secondary | ICD-10-CM | POA: Diagnosis not present

## 2020-01-10 DIAGNOSIS — Z90722 Acquired absence of ovaries, bilateral: Secondary | ICD-10-CM | POA: Diagnosis not present

## 2020-01-10 LAB — CBC WITH DIFFERENTIAL/PLATELET
Abs Immature Granulocytes: 0.02 10*3/uL (ref 0.00–0.07)
Basophils Absolute: 0.1 10*3/uL (ref 0.0–0.1)
Basophils Relative: 2 %
Eosinophils Absolute: 0.1 10*3/uL (ref 0.0–0.5)
Eosinophils Relative: 2 %
HCT: 40.1 % (ref 36.0–46.0)
Hemoglobin: 13 g/dL (ref 12.0–15.0)
Immature Granulocytes: 0 %
Lymphocytes Relative: 29 %
Lymphs Abs: 1.4 10*3/uL (ref 0.7–4.0)
MCH: 29.5 pg (ref 26.0–34.0)
MCHC: 32.4 g/dL (ref 30.0–36.0)
MCV: 91.1 fL (ref 80.0–100.0)
Monocytes Absolute: 0.4 10*3/uL (ref 0.1–1.0)
Monocytes Relative: 8 %
Neutro Abs: 3 10*3/uL (ref 1.7–7.7)
Neutrophils Relative %: 59 %
Platelets: 222 10*3/uL (ref 150–400)
RBC: 4.4 MIL/uL (ref 3.87–5.11)
RDW: 11.4 % — ABNORMAL LOW (ref 11.5–15.5)
WBC: 5 10*3/uL (ref 4.0–10.5)
nRBC: 0 % (ref 0.0–0.2)

## 2020-01-10 LAB — COMPREHENSIVE METABOLIC PANEL
ALT: 17 U/L (ref 0–44)
AST: 19 U/L (ref 15–41)
Albumin: 3.9 g/dL (ref 3.5–5.0)
Alkaline Phosphatase: 46 U/L (ref 38–126)
Anion gap: 10 (ref 5–15)
BUN: 13 mg/dL (ref 8–23)
CO2: 25 mmol/L (ref 22–32)
Calcium: 9.2 mg/dL (ref 8.9–10.3)
Chloride: 105 mmol/L (ref 98–111)
Creatinine, Ser: 0.97 mg/dL (ref 0.44–1.00)
GFR, Estimated: >60 mL/min (ref >60)
Glucose, Bld: 102 mg/dL — ABNORMAL HIGH (ref 70–99)
Potassium: 4 mmol/L (ref 3.5–5.1)
Sodium: 140 mmol/L (ref 135–145)
Total Bilirubin: 0.6 mg/dL (ref 0.3–1.2)
Total Protein: 7.2 g/dL (ref 6.5–8.1)

## 2020-01-10 MED ORDER — TAMOXIFEN CITRATE 20 MG PO TABS: 20.0000 mg | ORAL_TABLET | Freq: Every day | ORAL | 4 refills | Status: DC

## 2020-01-10 MED ORDER — VENLAFAXINE HCL ER 37.5 MG PO CP24: 37.5000 mg | ORAL_CAPSULE | Freq: Every day | ORAL | 4 refills | Status: DC

## 2020-01-10 NOTE — Addendum Note (Signed)
Addended by: Lowella Dell on: 01/10/2020 09:48 AM   Modules accepted: Orders

## 2020-02-06 ENCOUNTER — Other Ambulatory Visit: Payer: Self-pay

## 2020-02-06 NOTE — Progress Notes (Signed)
Gynecologic Oncology Return Clinic Visit  02/07/20  Reason for Visit: surveillance visit in the setting of early-stage endometrial cancer  Treatment History: Oncology History  Malignant neoplasm of overlapping sites of right breast in female, estrogen receptor positive ([place])  01/01/2017 Initial Biopsy   s/p Right breast overlapping sites biopsy x2 (different quadrants) on 01/01/2017 for a clinical mT2 N0,  Stage IB invasive lobular carcinoma, grade 2,  estrogen and progesterone receptor positive, HER-2 not amplified             (a) breast MRI on 01/16/2017 shows 2 additional areas of concern in the right breast   02/05/2017 Surgery   right mastectomy with sentinel lymph node sampling showed an mT2 N0, stage IB invasive lobular carcinoma, grade 2, with negative margins.  (Total of 4 lymph nodes removed   02/05/2017 Oncotype testing   The Oncotype DX score was 8, predicting a risk of outside the breast recurrence over the next 10 years of 3% if the patient's only systemic therapy is tamoxifen for 5 years.  It also predicts no significant benefit from chemotherapy.   02/27/2017 -  Anti-estrogen oral therapy   tamoxifen started 02/27/2017             (a) bone density at [facility] 12/12/2016 shows a T score of -3.2             (b) status post hysterectomy with bilateral salpingo-oophorectomy             (c) remote history of oral contraceptives with no complications   66/59/9357 Cancer Staging   Staging form: Breast, AJCC 8th Edition - Pathologic: Stage IA (pT2(m), pN0, cM0, G2, ER+, PR+, HER2-) - Signed by Gardenia Phlegm, NP on 10/21/2017   Endometrial cancer ([place])  02/28/2014 Initial Biopsy   D&C - gr 2 EMCA   02/28/2014 Initial Diagnosis   Endometrial cancer ([place])   04/13/2014 Surgery   RA TLH/BSO and PLND with Dr. Claiborne Billings in [facility]   04/13/2014 Pathologic Stage   Stage IB, gr 1 endometrioid adenocarcinoma, no LVSI Unremarkable cervix, bilateral adnexa, and 6 right and 8  left negative pelvic lymph nodes.  Depth of invasion was 0.7 out of 1.4 cm.  The tumor size was 2.3 cm     Interval History: Patient reports overall doing well since her last visit.  She continues to have occasional white vaginal discharge, unchanged from her last visit.  She denies any vaginal bleeding or spotting.  She reports normal bowel and bladder function.  She endorses a good appetite without any weight changes.  Last saw Dr. Jana Hakim on 01/10/20 for history of breast cancer, currently on Tamoxifen for a planned 10 years (started 02/2017).  Last mammogram was 12/2019.  She is scheduled for colonoscopy next week.  Past Medical/Surgical History: Past Medical History:  Diagnosis Date  . Breast cancer, right breast ([place])    S/P mastectomy 02/05/2017. Tamoxifen 2019 - ____  . Endometrial cancer ([place]) 2016    hysterectomy  . Family history of colon cancer   . Hypertension   . Rosacea    mainly on face    Past Surgical History:  Procedure Laterality Date  . BREAST BIOPSY  2007   core biopsy "think it was on the left breast"  . BREAST BIOPSY Right 12/2016  . DILATION AND CURETTAGE OF UTERUS  1975   S/P miscarriage  . MASTECTOMY COMPLETE / SIMPLE W/ SENTINEL NODE BIOPSY Right 02/05/2017   RIGHT SIMPLE MASTECTOMY WITH  AXILLARY SENTINEL NODE BIOPSY  . ROBOTIC ASSISTED TOTAL HYSTERECTOMY WITH BILATERAL SALPINGO OOPHERECTOMY  04/2014   and pelvic lymph node dissection - [facility]  . SIMPLE MASTECTOMY WITH AXILLARY SENTINEL NODE BIOPSY Right 02/05/2017   Procedure: RIGHT SIMPLE MASTECTOMY WITH AXILLARY SENTINEL NODE BIOPSY;  Surgeon: Erroll Luna, MD;  Location: [place];  Service: General;  Laterality: Right;    Family History  Problem Relation Age of Onset  . Colon cancer Father 12    Social History   Socioeconomic History  . Marital status: Married    Spouse name: Not on file  . Number of children: Not on file  . Years of education: Not on file  . Highest education level:  Not on file  Occupational History  . Not on file  Tobacco Use  . Smoking status: Never Smoker  . Smokeless tobacco: Never Used  Vaping Use  . Vaping Use: Never used  Substance and Sexual Activity  . Alcohol use: Yes    Comment: 02/05/2017 will drink wine once or twice a month  . Drug use: No  . Sexual activity: Not Currently  Other Topics Concern  . Not on file  Social History Narrative  . Not on file   Social Determinants of Health   Financial Resource Strain: Not on file  Food Insecurity: Not on file  Transportation Needs: Not on file  Physical Activity: Not on file  Stress: Not on file  Social Connections: Not on file    Current Medications:  Current Outpatient Medications:  .  Cholecalciferol (VITAMIN D3) 5000 units CAPS, Take 5,000 Units by mouth daily., Disp: , Rfl:  .  lisinopril (PRINIVIL,ZESTRIL) 20 MG tablet, Take 20 mg by mouth daily., Disp: , Rfl:  .  Polyethyl Glycol-Propyl Glycol (SYSTANE OP), Apply 1 drop to eye daily as needed (dry eyes)., Disp: , Rfl:  .  risedronate (ACTONEL) 150 MG tablet, Take 150 mg by mouth every 30 (thirty) days., Disp: , Rfl:  .  tamoxifen (NOLVADEX) 20 MG tablet, Take 1 tablet (20 mg total) by mouth daily., Disp: 90 tablet, Rfl: 4 .  venlafaxine XR (EFFEXOR-XR) 37.5 MG 24 hr capsule, Take 1 capsule (37.5 mg total) by mouth daily with breakfast., Disp: 90 capsule, Rfl: 4 .  doxylamine, Sleep, (SLEEP AID) 25 MG tablet, Take 25 mg by mouth at bedtime as needed for sleep., Disp: , Rfl:  .  metroNIDAZOLE (METROCREAM) 0.75 % cream, Apply 1 application topically daily as needed (rosacea). Patient applies to both eyes as needed, Disp: , Rfl:   Review of Systems: Denies appetite changes, fevers, chills, fatigue, unexplained weight changes. Denies hearing loss, neck lumps or masses, mouth sores, ringing in ears or voice changes. Denies cough or wheezing.  Denies shortness of breath. Denies chest pain or palpitations. Denies leg  swelling. Denies abdominal distention, pain, blood in stools, constipation, diarrhea, nausea, vomiting, or early satiety. Denies pain with intercourse, dysuria, frequency, hematuria or incontinence. Denies hot flashes, pelvic pain, vaginal bleeding.   Denies joint pain, back pain or muscle pain/cramps. Denies itching, rash, or wounds. Denies dizziness, headaches, numbness or seizures. Denies swollen lymph nodes or glands, denies easy bruising or bleeding. Denies anxiety, depression, confusion, or decreased concentration.  Physical Exam: BP (!) 158/77 (BP Location: Left Arm, Patient Position: Sitting)   Pulse (!) 106   Temp 97.9 F (36.6 C) (Tympanic)   Resp 20   Ht 5' 4" (1.626 m)   Wt 157 lb (71.2 kg)   SpO2 100%  Comment: RA  BMI 26.95 kg/m  General: Alert, oriented, no acute distress. HEENT: Normocephalic, atraumatic, sclera anicteric. Chest: Unlabored breathing on room air. Abdomen: soft, nontender.  Normoactive bowel sounds.  No masses or hepatosplenomegaly appreciated.  Well-healed incisions. Extremities: Grossly normal range of motion.  Warm, well perfused.  No edema bilaterally. Skin: No rashes or lesions noted. Lymphatics: No cervical, supraclavicular, or inguinal adenopathy. GU: Normal appearing external genitalia without erythema, excoriation, or lesions.  Speculum exam reveals mildly atrophic vaginal mucosa. Approximately 10 1-7m areas of glandular appearing tissue along the vaginal cuff. No masses, bleeding or discharge. Given appearance, biopsy performed.   Vaginal cuff biopsy Preoperative diagnosis: Vaginal cuff lesions Postoperative diagnosis: Same as above Procedure performed: Vaginal cuff biopsies Specimens: Vaginal cuff lesions Provider: KJeral Pinch MD Procedure in detail: After speculum exam was performed, biopsy of the vaginal cuff lesions was recommended.  Discussed the risks and benefits of this and patient gave verbal consent.  Vaginal cuff was  cleansed with Betadine x3.  Two lesions were biopsied and removed completely with Tischler forceps.  Pressure and silver nitrate was used to achieve hemostasis.  All instruments removed from the vagina.  Overall the patient tolerated the procedure well.  Laboratory & Radiologic Studies: None new  Assessment & Plan: SVANESA RENIERis a 75y.o. woman with a history of stage IB gr 1 EMCA.  Patient is doing very well.  She has been without significant symptoms.  SVernard Gamblesis that the glandular appearing spots on her vaginal cuff may be related to tamoxifen use.  Given their new appearance since her last visit, I recommended biopsy.  I will call her with the biopsy results release in my chart if no phone call necessary.  If biopsies are negative for malignancy as I suspect, then we will continue with yearly visits.  The patient is now proximately 6 months out from treatment.  I have asked her to call later this year to make a follow-up for early 2023.  We discussed the signs and symptoms concerning for disease recurrence and she knows to call the clinic if she develops any of these prior to her next visit.  22 minutes of total time was spent for this patient encounter, including preparation, face-to-face counseling with the patient and coordination of care, and documentation of the encounter.  KJeral Pinch MD  Division of Gynecologic Oncology  Department of Obstetrics and Gynecology  [facility]

## 2020-02-07 ENCOUNTER — Other Ambulatory Visit: Payer: Self-pay

## 2020-02-07 ENCOUNTER — Inpatient Hospital Stay: Payer: Medicare PPO | Attending: Gynecologic Oncology | Admitting: Gynecologic Oncology

## 2020-02-07 ENCOUNTER — Encounter: Payer: Self-pay | Admitting: Gynecologic Oncology

## 2020-02-07 VITALS — BP 158/77 | HR 106 | Temp 97.9°F | Resp 20 | Ht 64.0 in | Wt 157.0 lb

## 2020-02-07 DIAGNOSIS — Z79810 Long term (current) use of selective estrogen receptor modulators (SERMs): Secondary | ICD-10-CM | POA: Insufficient documentation

## 2020-02-07 DIAGNOSIS — Z9011 Acquired absence of right breast and nipple: Secondary | ICD-10-CM | POA: Diagnosis not present

## 2020-02-07 DIAGNOSIS — C50811 Malignant neoplasm of overlapping sites of right female breast: Secondary | ICD-10-CM | POA: Insufficient documentation

## 2020-02-07 DIAGNOSIS — I10 Essential (primary) hypertension: Secondary | ICD-10-CM | POA: Insufficient documentation

## 2020-02-07 DIAGNOSIS — Z90710 Acquired absence of both cervix and uterus: Secondary | ICD-10-CM | POA: Diagnosis not present

## 2020-02-07 DIAGNOSIS — C541 Malignant neoplasm of endometrium: Secondary | ICD-10-CM | POA: Insufficient documentation

## 2020-02-07 DIAGNOSIS — Z170 Estrogen receptor positive status [ER+]: Secondary | ICD-10-CM | POA: Insufficient documentation

## 2020-02-07 DIAGNOSIS — Z90722 Acquired absence of ovaries, bilateral: Secondary | ICD-10-CM | POA: Insufficient documentation

## 2020-02-07 DIAGNOSIS — Z79899 Other long term (current) drug therapy: Secondary | ICD-10-CM | POA: Insufficient documentation

## 2020-02-07 NOTE — Patient Instructions (Addendum)
I will call you or mychart message you with the results of your biopsy from today.  Please call the office at the end of 2022 to get a 1 year follow-up visit scheduled with me.  If you develop any issues before then, such as vaginal bleeding or pain, please call the clinic to be seen sooner.

## 2020-02-09 LAB — SURGICAL PATHOLOGY

## 2020-04-05 ENCOUNTER — Encounter: Payer: Self-pay | Admitting: Oncology

## 2020-04-05 ENCOUNTER — Other Ambulatory Visit: Payer: Self-pay | Admitting: Oncology

## 2020-06-18 ENCOUNTER — Other Ambulatory Visit: Payer: Self-pay | Admitting: Oncology

## 2020-06-18 DIAGNOSIS — Z170 Estrogen receptor positive status [ER+]: Secondary | ICD-10-CM

## 2020-09-24 ENCOUNTER — Other Ambulatory Visit: Payer: Self-pay | Admitting: Oncology

## 2020-09-24 DIAGNOSIS — C50811 Malignant neoplasm of overlapping sites of right female breast: Secondary | ICD-10-CM

## 2020-11-23 ENCOUNTER — Other Ambulatory Visit: Payer: Self-pay | Admitting: Oncology

## 2020-11-23 ENCOUNTER — Other Ambulatory Visit: Payer: Self-pay | Admitting: Internal Medicine

## 2020-11-23 DIAGNOSIS — Z1231 Encounter for screening mammogram for malignant neoplasm of breast: Secondary | ICD-10-CM

## 2020-11-27 ENCOUNTER — Telehealth: Payer: Self-pay | Admitting: *Deleted

## 2020-11-27 NOTE — Telephone Encounter (Signed)
Returned the patient's call and scheduled a follow up appt for 2/27 at 1 pm

## 2021-01-08 ENCOUNTER — Other Ambulatory Visit: Payer: Self-pay | Admitting: Oncology

## 2021-01-08 DIAGNOSIS — Z170 Estrogen receptor positive status [ER+]: Secondary | ICD-10-CM

## 2021-01-14 ENCOUNTER — Ambulatory Visit
Admission: RE | Admit: 2021-01-14 | Discharge: 2021-01-14 | Disposition: A | Discharge status: Home / Self Care | Payer: Medicare PPO | Source: Ambulatory Visit | Attending: Oncology | Admitting: Oncology

## 2021-01-14 DIAGNOSIS — Z1231 Encounter for screening mammogram for malignant neoplasm of breast: Secondary | ICD-10-CM

## 2021-01-15 ENCOUNTER — Other Ambulatory Visit: Payer: Medicare PPO

## 2021-01-15 ENCOUNTER — Ambulatory Visit: Payer: Medicare PPO | Admitting: Oncology

## 2021-01-16 ENCOUNTER — Other Ambulatory Visit: Payer: Self-pay | Admitting: *Deleted

## 2021-01-16 DIAGNOSIS — Z170 Estrogen receptor positive status [ER+]: Secondary | ICD-10-CM

## 2021-01-16 NOTE — Progress Notes (Unsigned)
Per MD orders placed for diagnostic Mammogram and [place] of left breast for further evaluation of asymmetry.  Appts scheduled, pt notivied and verbalized understanding of date and time.

## 2021-01-22 ENCOUNTER — Inpatient Hospital Stay: Payer: Medicare PPO

## 2021-01-22 ENCOUNTER — Inpatient Hospital Stay: Payer: Medicare PPO | Admitting: Hematology and Oncology

## 2021-02-09 ENCOUNTER — Ambulatory Visit
Admission: RE | Admit: 2021-02-09 | Discharge: 2021-02-09 | Disposition: A | Discharge status: Home / Self Care | Payer: Medicare PPO | Source: Ambulatory Visit | Attending: Hematology and Oncology | Admitting: Hematology and Oncology

## 2021-02-09 ENCOUNTER — Ambulatory Visit: Payer: Medicare PPO

## 2021-02-09 DIAGNOSIS — Z170 Estrogen receptor positive status [ER+]: Secondary | ICD-10-CM

## 2021-02-09 DIAGNOSIS — C50811 Malignant neoplasm of overlapping sites of right female breast: Secondary | ICD-10-CM

## 2021-02-18 ENCOUNTER — Other Ambulatory Visit: Payer: Medicare PPO

## 2021-02-22 ENCOUNTER — Other Ambulatory Visit: Payer: Self-pay

## 2021-02-22 DIAGNOSIS — C50811 Malignant neoplasm of overlapping sites of right female breast: Secondary | ICD-10-CM

## 2021-02-23 NOTE — Progress Notes (Signed)
Patient Care Team: Kristopher Glee., MD as PCP - General (Internal Medicine) Erroll Luna, MD as Consulting Physician (General Surgery) Magrinat, Virgie Dad, MD (Inactive) as Consulting Physician (Oncology) Maralyn Sago, MD as Referring Physician (Oncology) Delice Bison, Charlestine Massed, NP as Nurse Practitioner (Hematology and Oncology) Isabel Caprice, MD (Inactive) as Consulting Physician (Gynecologic Oncology)  DIAGNOSIS:    ICD-10-CM   1. Malignant neoplasm of overlapping sites of right breast in female, estrogen receptor positive ([place])  C50.811    Z17.0       SUMMARY OF ONCOLOGIC HISTORY: Oncology History  Malignant neoplasm of overlapping sites of right breast in female, estrogen receptor positive ([place])  01/01/2017 Initial Biopsy   s/p Right breast overlapping sites biopsy x2 (different quadrants) on 01/01/2017 for a clinical mT2 N0,  Stage IB invasive lobular carcinoma, grade 2,  estrogen and progesterone receptor positive, HER-2 not amplified             (a) breast MRI on 01/16/2017 shows 2 additional areas of concern in the right breast   02/05/2017 Surgery   right mastectomy with sentinel lymph node sampling showed an mT2 N0, stage IB invasive lobular carcinoma, grade 2, with negative margins.  (Total of 4 lymph nodes removed   02/05/2017 Oncotype testing   The Oncotype DX score was 8, predicting a risk of outside the breast recurrence over the next 10 years of 3% if the patient's only systemic therapy is tamoxifen for 5 years.  It also predicts no significant benefit from chemotherapy.   02/27/2017 -  Anti-estrogen oral therapy   tamoxifen started 02/27/2017             (a) bone density at [facility] 12/12/2016 shows a T score of -3.2             (b) status post hysterectomy with bilateral salpingo-oophorectomy             (c) remote history of oral contraceptives with no complications   78/24/2353 Cancer Staging   Staging form: Breast, AJCC 8th Edition -  Pathologic: Stage IA (pT2(m), pN0, cM0, G2, ER+, PR+, HER2-) - Signed by Gardenia Phlegm, NP on 10/21/2017    Endometrial cancer ([place])  02/28/2014 Initial Biopsy   D&C - gr 2 EMCA   02/28/2014 Initial Diagnosis   Endometrial cancer ([place])   04/13/2014 Surgery   RA TLH/BSO and PLND with Dr. 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  Depth of invasion was 0.7 out of 1.4 cm.  The tumor size was 2.3 cm     CHIEF COMPLIANT: Follow-up of estrogen receptor positive breast cancer and to set up oncology care  INTERVAL HISTORY: Doris Maynard is a 75 y.o. with above-mentioned history of estrogen receptor positive breast cancer. Mammogram on 02/09/2021 showed no evidence of malignancy. She presents to the clinic today for follow-up.  She denies any lumps or nodules in the breast.  She is tolerating tamoxifen extremely well.  She is a retired Information systems manager and enjoys traveling and spending time with her children and grandchildren.  ALLERGIES:  is allergic to clindamycin.  MEDICATIONS:  Current Outpatient Medications  Medication Sig Dispense Refill   Cholecalciferol (VITAMIN D3) 5000 units CAPS Take 5,000 Units by mouth daily.     doxylamine, Sleep, (SLEEP AID) 25 MG tablet Take 25 mg by  mouth at bedtime as needed for sleep.     lisinopril (PRINIVIL,ZESTRIL) 20 MG tablet Take 20 mg by mouth daily.     metroNIDAZOLE (METROCREAM) 0.75 % cream Apply 1 application topically daily as needed (rosacea). Patient applies to both eyes as needed     Polyethyl Glycol-Propyl Glycol (SYSTANE OP) Apply 1 drop to eye daily as needed (dry eyes).     risedronate (ACTONEL) 150 MG tablet Take 150 mg by mouth every 30 (thirty) days.     tamoxifen (NOLVADEX) 20 MG tablet Take 1 tablet (20 mg total) by mouth daily. 90 tablet 4   venlafaxine XR  (EFFEXOR-XR) 37.5 MG 24 hr capsule TAKE 1 CAPSULE BY MOUTH ONCE DAILY WITH BREAKFAST 90 capsule 0   No current facility-administered medications for this visit.    PHYSICAL EXAMINATION: ECOG PERFORMANCE STATUS: 1 - Symptomatic but completely ambulatory  Vitals:   02/25/21 1108  BP: (!) 158/81  Pulse: (!) 116  Resp: 18  Temp: 98.1 F (36.7 C)  SpO2: 100%   Filed Weights   02/25/21 1108  Weight: 157 lb 8 oz (71.4 kg)    BREAST: Right mastectomy scar without any lumps or nodules.  Left breast no lumps or nodules in the breast or axilla (exam performed in the presence of a chaperone)  LABORATORY DATA:  I have reviewed the data as listed CMP Latest Ref Rng & Units 02/25/2021 01/10/2020 01/04/2019  Glucose 70 - 99 mg/dL 96 102(H) 124(H)  BUN 8 - 23 mg/dL _0 Creatinine 0.44 - 1.00 mg/dL 0.98 0.97 0.90  Sodium 135 - 145 mmol/L 141 140 141  Potassium 3.5 - 5.1 mmol/L 4.5 4.0 4.1  Chloride 98 - 111 mmol/L 106 105 107  CO2 22 - 32 mmol/L _1 Calcium 8.9 - 10.3 mg/dL 9.4 9.2 8.8(L)  Total Protein 6.5 - 8.1 g/dL 7.4 7.2 7.0  Total Bilirubin 0.3 - 1.2 mg/dL 0.6 0.6 0.4  Alkaline Phos 38 - 126 U/L 53 46 53  AST 15 - 41 U/L _2 ALT 0 - 44 U/L _3 Lab Results  Component Value Date   WBC 5.5 02/25/2021   HGB 13.7 02/25/2021   HCT 41.3 02/25/2021   MCV 90.6 02/25/2021   PLT 229 02/25/2021   NEUTROABS 3.3 02/25/2021    ASSESSMENT & PLAN:  Malignant neoplasm of overlapping sites of right breast in female, estrogen receptor positive ([place]) 01/18/2017: Right lumpectomy: T2N0 stage Ib ILC, grade 2, margins negative, 0/4 lymph nodes negative, ER 95%, PR 95%, Ki-67 15%, HER2 negative ratio 1.57 Oncotype DX score: 8 (risk of distant recurrence 3%) Retired Information systems manager.  Current treatment: Tamoxifen started 02/27/2017 (due to osteoporosis T score -3.2 12/12/2016) Tamoxifen toxicities: Denies any major side effects to tamoxifen.  Denies any hot flashes or  arthralgias or myalgias. She takes Effexor for mood swings/depression.  Renewed her prescription today.  Next year we will consider sending for breast cancer index.  This will determine if she needs to stay on tamoxifen for 10 years  Breast cancer surveillance: 1.  Breast exam 02/25/2021: Benign 2. mammogram 02/09/2021: Benign breast density category C  Return to clinic in 1 year for follow-up    No orders of the defined types were placed in this encounter.  The patient has a good understanding of the overall plan. she agrees with it. she will call with any problems that may develop before the next visit here.  Total time spent: 30 mins including face to face time and time spent for planning, charting and coordination of care  Rulon Eisenmenger, MD, MPH 02/25/2021  I, Thana Ates, am acting as scribe for Dr. Nicholas Lose.  I have reviewed the above documentation for accuracy and completeness, and I agree with the above.

## 2021-02-25 ENCOUNTER — Other Ambulatory Visit: Payer: Self-pay

## 2021-02-25 ENCOUNTER — Inpatient Hospital Stay: Payer: Medicare PPO

## 2021-02-25 ENCOUNTER — Inpatient Hospital Stay: Payer: Medicare PPO | Attending: Oncology | Admitting: Hematology and Oncology

## 2021-02-25 DIAGNOSIS — Z79810 Long term (current) use of selective estrogen receptor modulators (SERMs): Secondary | ICD-10-CM | POA: Diagnosis not present

## 2021-02-25 DIAGNOSIS — Z8542 Personal history of malignant neoplasm of other parts of uterus: Secondary | ICD-10-CM | POA: Diagnosis present

## 2021-02-25 DIAGNOSIS — Z170 Estrogen receptor positive status [ER+]: Secondary | ICD-10-CM | POA: Insufficient documentation

## 2021-02-25 DIAGNOSIS — C50811 Malignant neoplasm of overlapping sites of right female breast: Secondary | ICD-10-CM | POA: Diagnosis present

## 2021-02-25 DIAGNOSIS — Z90710 Acquired absence of both cervix and uterus: Secondary | ICD-10-CM | POA: Insufficient documentation

## 2021-02-25 DIAGNOSIS — Z9011 Acquired absence of right breast and nipple: Secondary | ICD-10-CM | POA: Insufficient documentation

## 2021-02-25 DIAGNOSIS — Z90722 Acquired absence of ovaries, bilateral: Secondary | ICD-10-CM | POA: Insufficient documentation

## 2021-02-25 LAB — CBC WITH DIFFERENTIAL (CANCER CENTER ONLY)
Abs Immature Granulocytes: 0.02 10*3/uL (ref 0.00–0.07)
Basophils Absolute: 0.1 10*3/uL (ref 0.0–0.1)
Basophils Relative: 1 %
Eosinophils Absolute: 0.1 10*3/uL (ref 0.0–0.5)
Eosinophils Relative: 2 %
HCT: 41.3 % (ref 36.0–46.0)
Hemoglobin: 13.7 g/dL (ref 12.0–15.0)
Immature Granulocytes: 0 %
Lymphocytes Relative: 29 %
Lymphs Abs: 1.6 10*3/uL (ref 0.7–4.0)
MCH: 30 pg (ref 26.0–34.0)
MCHC: 33.2 g/dL (ref 30.0–36.0)
MCV: 90.6 fL (ref 80.0–100.0)
Monocytes Absolute: 0.4 10*3/uL (ref 0.1–1.0)
Monocytes Relative: 8 %
Neutro Abs: 3.3 10*3/uL (ref 1.7–7.7)
Neutrophils Relative %: 60 %
Platelet Count: 229 10*3/uL (ref 150–400)
RBC: 4.56 MIL/uL (ref 3.87–5.11)
RDW: 11.9 % (ref 11.5–15.5)
WBC Count: 5.5 10*3/uL (ref 4.0–10.5)
nRBC: 0 % (ref 0.0–0.2)

## 2021-02-25 LAB — CMP (CANCER CENTER ONLY)
ALT: 11 U/L (ref 0–44)
AST: 17 U/L (ref 15–41)
Albumin: 4.3 g/dL (ref 3.5–5.0)
Alkaline Phosphatase: 53 U/L (ref 38–126)
Anion gap: 8 (ref 5–15)
BUN: 13 mg/dL (ref 8–23)
CO2: 27 mmol/L (ref 22–32)
Calcium: 9.4 mg/dL (ref 8.9–10.3)
Chloride: 106 mmol/L (ref 98–111)
Creatinine: 0.98 mg/dL (ref 0.44–1.00)
GFR, Estimated: >60 mL/min (ref >60)
Glucose, Bld: 96 mg/dL (ref 70–99)
Potassium: 4.5 mmol/L (ref 3.5–5.1)
Sodium: 141 mmol/L (ref 135–145)
Total Bilirubin: 0.6 mg/dL (ref 0.3–1.2)
Total Protein: 7.4 g/dL (ref 6.5–8.1)

## 2021-02-25 MED ORDER — TAMOXIFEN CITRATE 20 MG PO TABS: 20.0000 mg | ORAL_TABLET | Freq: Every day | ORAL | 4 refills | Status: DC

## 2021-02-25 MED ORDER — VENLAFAXINE HCL ER 37.5 MG PO CP24: 37.5000 mg | ORAL_CAPSULE | Freq: Every day | ORAL | 3 refills | Status: DC

## 2021-02-25 NOTE — Assessment & Plan Note (Signed)
01/18/2017: Right lumpectomy: T2N0 stage Ib ILC, grade 2, margins negative, 0/4 lymph nodes negative, ER 95%, PR 95%, Ki-67 15%, HER2 negative ratio 1.57 Oncotype DX score: 8 (risk of distant recurrence 3%)  Current treatment: Tamoxifen started 02/27/2017 (due to osteoporosis T score -3.2 12/12/2016) Tamoxifen toxicities:  Next year we will consider sending for breast cancer index.  This will determine if she needs to stay on tamoxifen for 10 years  Breast cancer surveillance: 1.  Breast exam 02/25/2021: Benign 2. mammogram 02/09/2021: Benign breast density category C  Return to clinic in 1 year for follow-up

## 2021-02-28 ENCOUNTER — Encounter: Payer: Self-pay | Admitting: *Deleted

## 2021-02-28 ENCOUNTER — Encounter: Payer: Self-pay | Admitting: Hematology and Oncology

## 2021-02-28 NOTE — Progress Notes (Signed)
Per MD request RN successfully faxed [facility] testing request ([phone]). 

## 2021-03-04 ENCOUNTER — Encounter: Payer: Self-pay | Admitting: Gynecologic Oncology

## 2021-03-04 ENCOUNTER — Other Ambulatory Visit: Payer: Self-pay

## 2021-03-04 ENCOUNTER — Inpatient Hospital Stay (HOSPITAL_BASED_OUTPATIENT_CLINIC_OR_DEPARTMENT_OTHER): Payer: Medicare PPO | Admitting: Gynecologic Oncology

## 2021-03-04 VITALS — BP 149/85 | HR 99 | Temp 97.9°F | Resp 18 | Ht 64.57 in | Wt 160.8 lb

## 2021-03-04 DIAGNOSIS — Z8542 Personal history of malignant neoplasm of other parts of uterus: Secondary | ICD-10-CM

## 2021-03-04 DIAGNOSIS — C541 Malignant neoplasm of endometrium: Secondary | ICD-10-CM

## 2021-03-04 DIAGNOSIS — C50811 Malignant neoplasm of overlapping sites of right female breast: Secondary | ICD-10-CM | POA: Diagnosis not present

## 2021-03-04 NOTE — Patient Instructions (Signed)
It was good to see you today.  I do not see or feel any evidence of cancer recurrence on your exam.  There continue to be the small spots at the top of the vagina that we biopsied last year, no change in their appearance.  I recommend continued follow-up every year.  This does not have to be with a cancer doctor as we typically release you from care once you are 5 years out from your diagnosis and treatment.  Because you do not have an OB/GYN in the area, please call back sometime after the new year to schedule a follow-up appointment with either Dr. Delsa Sale or with Joylene John, NP.  As always, if you develop any new or concerning symptoms, such as vaginal bleeding, pelvic pain, change to bowel function, or unintentional weight loss, please call to see me sooner.

## 2021-03-04 NOTE — Progress Notes (Signed)
Gynecologic Oncology Return Clinic Visit  03/04/21  Reason for Visit: surveillance visit in the setting of early-stage endometrial cancer  Treatment History: Oncology History  Malignant neoplasm of overlapping sites of right breast in female, estrogen receptor positive ([place])  01/01/2017 Initial Biopsy   s/p Right breast overlapping sites biopsy x2 (different quadrants) on 01/01/2017 for a clinical mT2 N0,  Stage IB invasive lobular carcinoma, grade 2,  estrogen and progesterone receptor positive, HER-2 not amplified             (a) breast MRI on 01/16/2017 shows 2 additional areas of concern in the right breast   02/05/2017 Surgery   right mastectomy with sentinel lymph node sampling showed an mT2 N0, stage IB invasive lobular carcinoma, grade 2, with negative margins.  (Total of 4 lymph nodes removed   02/05/2017 Oncotype testing   The Oncotype DX score was 8, predicting a risk of outside the breast recurrence over the next 10 years of 3% if the patient's only systemic therapy is tamoxifen for 5 years.  It also predicts no significant benefit from chemotherapy.   02/27/2017 -  Anti-estrogen oral therapy   tamoxifen started 02/27/2017             (a) bone density at [facility] 12/12/2016 shows a T score of -3.2             (b) status post hysterectomy with bilateral salpingo-oophorectomy             (c) remote history of oral contraceptives with no complications   70/78/6754 Cancer Staging   Staging form: Breast, AJCC 8th Edition - Pathologic: Stage IA (pT2(m), pN0, cM0, G2, ER+, PR+, HER2-) - Signed by Gardenia Phlegm, NP on 10/21/2017    Endometrial cancer ([place])  02/28/2014 Initial Biopsy   D&C - gr 2 EMCA   02/28/2014 Initial Diagnosis   Endometrial cancer ([place])   04/13/2014 Surgery   RA TLH/BSO and PLND with Dr. 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  Depth of invasion was 0.7 out of 1.4 cm.  The tumor size was 2.3 cm     Interval History: Patient reports doing well.  She denies any abdominal or pelvic pain.  Denies any bleeding or discharge.  Reports regular bowel function.  Continues to note her posterior prolapse but is not having difficulty with bowel movements.  Endorses a good appetite without nausea or emesis.  She last saw Dr. Payton Mccallum for history of breast cancer on 2/20.  Mammogram was earlier this month.  Past Medical/Surgical History: Past Medical History:  Diagnosis Date   Breast cancer, right breast ([place])    S/P mastectomy 02/05/2017. Tamoxifen 2019 - ____   Endometrial cancer ([place]) 2016    hysterectomy   Family history of colon cancer    Hypertension    Rosacea    mainly on face    Past Surgical History:  Procedure Laterality Date   BREAST BIOPSY  2007   core biopsy "think it was on the left breast"   BREAST BIOPSY Right 12/2016   DILATION AND CURETTAGE OF UTERUS  1975   S/P miscarriage   MASTECTOMY COMPLETE / SIMPLE W/ SENTINEL NODE BIOPSY Right 02/05/2017   RIGHT SIMPLE MASTECTOMY WITH AXILLARY SENTINEL NODE BIOPSY   ROBOTIC ASSISTED TOTAL HYSTERECTOMY WITH BILATERAL SALPINGO OOPHERECTOMY  04/2014  and pelvic lymph node dissection - [facility]   SIMPLE MASTECTOMY WITH AXILLARY SENTINEL NODE BIOPSY Right 02/05/2017   Procedure: RIGHT SIMPLE MASTECTOMY WITH AXILLARY SENTINEL NODE BIOPSY;  Surgeon: Erroll Luna, MD;  Location: [place];  Service: General;  Laterality: Right;    Family History  Problem Relation Age of Onset   Colon cancer Father 80    Social History   Socioeconomic History   Marital status: Married    Spouse name: Not on file   Number of children: Not on file   Years of education: Not on file   Highest education level: Not on file  Occupational History   Not on file  Tobacco Use   Smoking status: Never   Smokeless tobacco: Never  Vaping Use    Vaping Use: Never used  Substance and Sexual Activity   Alcohol use: Yes    Comment: 02/05/2017 will drink wine once or twice a month   Drug use: No   Sexual activity: Not Currently  Other Topics Concern   Not on file  Social History Narrative   Not on file   Social Determinants of Health   Financial Resource Strain: Not on file  Food Insecurity: Not on file  Transportation Needs: Not on file  Physical Activity: Not on file  Stress: Not on file  Social Connections: Not on file    Current Medications:  Current Outpatient Medications:    doxylamine, Sleep, (SLEEP AID) 25 MG tablet, Take 25 mg by mouth at bedtime as needed for sleep., Disp: , Rfl:    lisinopril (PRINIVIL,ZESTRIL) 20 MG tablet, Take 20 mg by mouth daily., Disp: , Rfl:    metroNIDAZOLE (METROCREAM) 0.75 % cream, Apply 1 application topically daily as needed (rosacea). Patient applies to both eyes as needed, Disp: , Rfl:    Polyethyl Glycol-Propyl Glycol (SYSTANE OP), Apply 1 drop to eye daily as needed (dry eyes)., Disp: , Rfl:    risedronate (ACTONEL) 150 MG tablet, Take 150 mg by mouth every 30 (thirty) days., Disp: , Rfl:    tamoxifen (NOLVADEX) 20 MG tablet, Take 1 tablet (20 mg total) by mouth daily., Disp: 90 tablet, Rfl: 4   venlafaxine XR (EFFEXOR-XR) 37.5 MG 24 hr capsule, Take 1 capsule (37.5 mg total) by mouth daily with breakfast., Disp: 90 capsule, Rfl: 3  Review of Systems: Denies appetite changes, fevers, chills, fatigue, unexplained weight changes. Denies hearing loss, neck lumps or masses, mouth sores, ringing in ears or voice changes. Denies cough or wheezing.  Denies shortness of breath. Denies chest pain or palpitations. Denies leg swelling. Denies abdominal distention, pain, blood in stools, constipation, diarrhea, nausea, vomiting, or early satiety. Denies pain with intercourse, dysuria, frequency, hematuria or incontinence. Denies hot flashes, pelvic pain, vaginal bleeding or vaginal discharge.    Denies joint pain, back pain or muscle pain/cramps. Denies itching, rash, or wounds. Denies dizziness, headaches, numbness or seizures. Denies swollen lymph nodes or glands, denies easy bruising or bleeding. Denies anxiety, depression, confusion, or decreased concentration.  Physical Exam: BP (!) 149/85 (BP Location: Left Arm, Patient Position: Sitting)    Pulse 99    Temp 97.9 F (36.6 C) (Oral)    Resp 18    Ht 5' 4.57" (1.64 m)    Wt 160 lb 12.8 oz (72.9 kg)    SpO2 100%    BMI 27.12 kg/m  General: Alert, oriented, no acute distress. HEENT: Posterior oropharynx clear, sclera anicteric. Chest: Clear to auscultation bilaterally.  No wheezes or  rhonchi. Cardiovascular: Regular rate and rhythm, no murmurs. Abdomen: soft, nontender.  Normoactive bowel sounds.  No masses or hepatosplenomegaly appreciated.  Well-healed incisions.  Extremities: Grossly normal range of motion.  Warm, well perfused.  No edema bilaterally. Skin: No rashes or lesions noted. Lymphatics: No cervical, supraclavicular, or inguinal adenopathy. GU: Normal appearing external genitalia without erythema, excoriation, or lesions.  Speculum exam reveals mildly atrophic vaginal mucosa. Approximately 10 1-74m areas of glandular appearing tissue along the vaginal cuff. No masses, bleeding or discharge. Given appearance, biopsy performed.    Laboratory & Radiologic Studies: Vaginal cuff biopsy from 02/07/2020 shows benign squamous mucosa with reactive lymphoid hyperplasia.  Assessment & Plan: SHAJRA PORTis a 76y.o. woman with a history of stage IB gr 1 EMCA. Definitive surgery in 2016.  Did not receive adjuvant radiation.   Patient is doing well and is NED on exam today.  We discussed signs and symptoms of recurrence that should prompt a phone call before her neck scheduled visit.  She is now just shy of 7 years out from definitive surgery without evidence of recurrence.  We discussed that normally we transition to  yearly visits with an OB/GYN after 5 years.  Patient does not have an OB/GYN.  I offered that she could continue yearly visits with our clinic and either see Dr. JDelsa Saleor MJoylene John NP.  I have asked the patient to call back after the first of next year to schedule that visit.  In terms of her posterior prolapse, she is not having any difficulties with bowel movements.  I have asked her to let me know if she begins having to reduce the prolapse or use other maneuvers to help her defecate.  With regard to her vaginal cuff lesions, these were previously biopsied and looks similar on exam.  No biopsy or further intervention required unless they were to change in appearance or she were to begin having symptoms such as vaginal bleeding.  29 minutes of total time was spent for this patient encounter, including preparation, face-to-face counseling with the patient and coordination of care, and documentation of the encounter.  KJeral Pinch MD  Division of Gynecologic Oncology  Department of Obstetrics and Gynecology  [facility]

## 2021-03-05 NOTE — Progress Notes (Signed)
Faxed MD notes to [place] to assist in ins approval for [employer].  Fax# [phone]

## 2021-03-18 ENCOUNTER — Encounter: Payer: Self-pay | Admitting: *Deleted

## 2021-03-18 NOTE — Progress Notes (Signed)
Received [employer] results and placed in scan folder to be scanned into Epic.  ?

## 2021-03-29 ENCOUNTER — Telehealth: Payer: Self-pay | Admitting: Hematology and Oncology

## 2021-03-29 ENCOUNTER — Encounter (HOSPITAL_COMMUNITY): Payer: Self-pay

## 2021-03-29 NOTE — Telephone Encounter (Signed)
I informed the patient that the breast cancer index test came back as though she would not benefit from extended endocrine therapy. ?Therefore she will stop tamoxifen in February 2024. ?Risk of distant recurrence between year 5-10: 6.5% ?

## 2022-01-17 ENCOUNTER — Other Ambulatory Visit: Payer: Self-pay | Admitting: Hematology and Oncology

## 2022-01-17 DIAGNOSIS — Z1231 Encounter for screening mammogram for malignant neoplasm of breast: Secondary | ICD-10-CM

## 2022-02-17 ENCOUNTER — Ambulatory Visit: Payer: Medicare PPO

## 2022-02-25 ENCOUNTER — Inpatient Hospital Stay: Payer: Medicare PPO

## 2022-02-25 ENCOUNTER — Inpatient Hospital Stay: Payer: Medicare PPO | Admitting: Hematology and Oncology

## 2022-04-02 NOTE — Progress Notes (Signed)
Patient Care Team: Andreas Blower., MD as PCP - General (Internal Medicine) Harriette Bouillon, MD as Consulting Physician (General Surgery) Magrinat, Valentino Hue, MD (Inactive) as Consulting Physician (Oncology) Will Bonnet, MD as Referring Physician (Oncology) Axel Filler, Larna Daughters, NP as Nurse Practitioner (Hematology and Oncology) Shonna Chock, MD (Inactive) as Consulting Physician (Gynecologic Oncology)  DIAGNOSIS:  Encounter Diagnosis  Name Primary?   Malignant neoplasm of overlapping sites of right breast in female, estrogen receptor positive Yes    SUMMARY OF ONCOLOGIC HISTORY: Oncology History  Malignant neoplasm of overlapping sites of right breast in female, estrogen receptor positive  01/01/2017 Initial Biopsy   s/p Right breast overlapping sites biopsy x2 (different quadrants) on 01/01/2017 for a clinical mT2 N0,  Stage IB invasive lobular carcinoma, grade 2,  estrogen and progesterone receptor positive, HER-2 not amplified             (a) breast MRI on 01/16/2017 shows 2 additional areas of concern in the right breast   02/05/2017 Surgery   right mastectomy with sentinel lymph node sampling showed an mT2 N0, stage IB invasive lobular carcinoma, grade 2, with negative margins.  (Total of 4 lymph nodes removed   02/05/2017 Oncotype testing   The Oncotype DX score was 8, predicting a risk of outside the breast recurrence over the next 10 years of 3% if the patient's only systemic therapy is tamoxifen for 5 years.  It also predicts no significant benefit from chemotherapy.   02/27/2017 -  Anti-estrogen oral therapy   tamoxifen started 02/27/2017             (a) bone density at [facility] 12/12/2016 shows a T score of -3.2             (b) status post hysterectomy with bilateral salpingo-oophorectomy             (c) remote history of oral contraceptives with no complications   10/21/2017 Cancer Staging   Staging form: Breast, AJCC 8th Edition - Pathologic: Stage  IA (pT2(m), pN0, cM0, G2, ER+, PR+, HER2-) - Signed by Loa Socks, NP on 10/21/2017   Endometrial cancer  02/28/2014 Initial Biopsy   D&C - gr 2 EMCA   02/28/2014 Initial Diagnosis   Endometrial cancer ([facility])   04/13/2014 Surgery   RA TLH/BSO and PLND with Dr. 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  Depth of invasion was 0.7 out of 1.4 cm.  The tumor size was 2.3 cm     CHIEF COMPLIANT: Follow-up of estrogen receptor positive breast cancer   INTERVAL HISTORY: Doris Maynard is a 77 y.o. with above-mentioned history of estrogen receptor positive breast cancer. She presents to the clinic for a follow-up. She completed tamoxifen 01/31/22. She has been staying active and exercise regularly.  Her blood pressure is slightly elevated.  She tells me that it is much better at home.    ALLERGIES:  is allergic to clindamycin.  MEDICATIONS:  Current Outpatient Medications  Medication Sig Dispense Refill   doxylamine, Sleep, (SLEEP AID) 25 MG tablet Take 25 mg by mouth at bedtime as needed for sleep.     lisinopril (PRINIVIL,ZESTRIL) 20 MG tablet Take 20 mg by mouth daily.     Polyethyl Glycol-Propyl Glycol (SYSTANE OP) Apply 1 drop to eye daily as needed (dry eyes).  risedronate (ACTONEL) 150 MG tablet Take 150 mg by mouth every 30 (thirty) days.     venlafaxine XR (EFFEXOR-XR) 37.5 MG 24 hr capsule Take 1 capsule (37.5 mg total) by mouth daily with breakfast. 90 capsule 3   No current facility-administered medications for this visit.    PHYSICAL EXAMINATION: ECOG PERFORMANCE STATUS: 0 - Asymptomatic  Vitals:   04/11/22 0905  BP: (!) 173/95  Pulse: (!) 108  Resp: 18  Temp: 97.7 F (36.5 C)  SpO2: 98%   Filed Weights   04/11/22 0905  Weight: 155 lb 4.8 oz (70.4 kg)    BREAST: No palpable masses or nodules  in  left breast or right chest wall. No palpable axillary supraclavicular or infraclavicular adenopathy no breast tenderness or nipple discharge. (exam performed in the presence of a chaperone)  LABORATORY DATA:  I have reviewed the data as listed    Latest Ref Rng & Units 04/11/2022    8:52 AM 02/25/2021   10:45 AM 01/10/2020    9:05 AM  CMP  Glucose 70 - 99 mg/dL 741  96  287   BUN 8 - 23 mg/dL 12  13  13    Creatinine 0.44 - 1.00 mg/dL 8.67  6.72  0.94   Sodium 135 - 145 mmol/L 140  141  140   Potassium 3.5 - 5.1 mmol/L 4.0  4.5  4.0   Chloride 98 - 111 mmol/L 105  106  105   CO2 22 - 32 mmol/L 25  27  25    Calcium 8.9 - 10.3 mg/dL 9.1  9.4  9.2   Total Protein 6.5 - 8.1 g/dL 7.6  7.4  7.2   Total Bilirubin 0.3 - 1.2 mg/dL 0.6  0.6  0.6   Alkaline Phos 38 - 126 U/L 63  53  46   AST 15 - 41 U/L 22  17  19    ALT 0 - 44 U/L 18  11  17      Lab Results  Component Value Date   WBC 6.9 04/11/2022   HGB 13.6 04/11/2022   HCT 42.3 04/11/2022   MCV 92.4 04/11/2022   PLT 235 04/11/2022   NEUTROABS 4.2 04/11/2022    ASSESSMENT & PLAN:  Malignant neoplasm of overlapping sites of right breast in female, estrogen receptor positive ([facility]) 01/18/2017: Right lumpectomy: T2N0 stage Ib ILC, grade 2, margins negative, 0/4 lymph nodes negative, ER 95%, PR 95%, Ki-67 15%, HER2 negative ratio 1.57 Oncotype DX score: 8 (risk of distant recurrence 3%) 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   Current treatment: Tamoxifen started 02/27/2017 (due to osteoporosis T score -3.2 12/12/2016) completed February 2024  She takes Effexor for mood swings/depression.      Breast cancer index indicated that she would not benefit from extended endocrine therapy.  Therefore she stopped antiestrogen therapy in 01/31/2022   Breast cancer surveillance: 1.  Breast exam 04/11/2022: Benign 2. mammogram 04/07/2022: Benign breast density category B   Return to clinic in 1 year for follow-up    No orders of the defined types were placed  in this encounter.  The patient has a good understanding of the overall plan. she agrees with it. she will call with any problems that may develop before the next visit here. Total time spent: 30 mins including face to face time and time spent for planning, charting and co-ordination of care   Tamsen Meek, MD 04/11/22    I Janan Ridge am acting as a Neurosurgeon for Dr.Keymari Sato [employer]  I have reviewed the above documentation for accuracy and completeness, and I agree with the above.

## 2022-04-04 ENCOUNTER — Ambulatory Visit
Admission: RE | Admit: 2022-04-04 | Discharge: 2022-04-04 | Disposition: A | Discharge status: Home / Self Care | Payer: Medicare PPO | Source: Ambulatory Visit | Attending: Hematology and Oncology | Admitting: Hematology and Oncology

## 2022-04-04 DIAGNOSIS — Z1231 Encounter for screening mammogram for malignant neoplasm of breast: Secondary | ICD-10-CM

## 2022-04-10 ENCOUNTER — Other Ambulatory Visit: Payer: Self-pay | Admitting: *Deleted

## 2022-04-10 DIAGNOSIS — Z170 Estrogen receptor positive status [ER+]: Secondary | ICD-10-CM

## 2022-04-11 ENCOUNTER — Inpatient Hospital Stay (HOSPITAL_BASED_OUTPATIENT_CLINIC_OR_DEPARTMENT_OTHER): Payer: Medicare PPO | Admitting: Hematology and Oncology

## 2022-04-11 ENCOUNTER — Inpatient Hospital Stay: Payer: Medicare PPO | Attending: Nurse Practitioner

## 2022-04-11 VITALS — BP 173/95 | HR 108 | Temp 97.7°F | Resp 18 | Ht 64.57 in | Wt 155.3 lb

## 2022-04-11 DIAGNOSIS — C50811 Malignant neoplasm of overlapping sites of right female breast: Secondary | ICD-10-CM

## 2022-04-11 DIAGNOSIS — Z170 Estrogen receptor positive status [ER+]: Secondary | ICD-10-CM

## 2022-04-11 DIAGNOSIS — Z853 Personal history of malignant neoplasm of breast: Secondary | ICD-10-CM | POA: Diagnosis present

## 2022-04-11 DIAGNOSIS — Z8542 Personal history of malignant neoplasm of other parts of uterus: Secondary | ICD-10-CM | POA: Insufficient documentation

## 2022-04-11 DIAGNOSIS — M810 Age-related osteoporosis without current pathological fracture: Secondary | ICD-10-CM | POA: Insufficient documentation

## 2022-04-11 DIAGNOSIS — Z79899 Other long term (current) drug therapy: Secondary | ICD-10-CM | POA: Diagnosis not present

## 2022-04-11 DIAGNOSIS — R030 Elevated blood-pressure reading, without diagnosis of hypertension: Secondary | ICD-10-CM | POA: Diagnosis not present

## 2022-04-11 DIAGNOSIS — F32A Depression, unspecified: Secondary | ICD-10-CM | POA: Diagnosis not present

## 2022-04-11 DIAGNOSIS — Z9011 Acquired absence of right breast and nipple: Secondary | ICD-10-CM | POA: Diagnosis not present

## 2022-04-11 LAB — CBC WITH DIFFERENTIAL (CANCER CENTER ONLY)
Abs Immature Granulocytes: 0.02 10*3/uL (ref 0.00–0.07)
Basophils Absolute: 0.1 10*3/uL (ref 0.0–0.1)
Basophils Relative: 1 %
Eosinophils Absolute: 0.2 10*3/uL (ref 0.0–0.5)
Eosinophils Relative: 3 %
HCT: 42.3 % (ref 36.0–46.0)
Hemoglobin: 13.6 g/dL (ref 12.0–15.0)
Immature Granulocytes: 0 %
Lymphocytes Relative: 27 %
Lymphs Abs: 1.8 10*3/uL (ref 0.7–4.0)
MCH: 29.7 pg (ref 26.0–34.0)
MCHC: 32.2 g/dL (ref 30.0–36.0)
MCV: 92.4 fL (ref 80.0–100.0)
Monocytes Absolute: 0.6 10*3/uL (ref 0.1–1.0)
Monocytes Relative: 8 %
Neutro Abs: 4.2 10*3/uL (ref 1.7–7.7)
Neutrophils Relative %: 61 %
Platelet Count: 235 10*3/uL (ref 150–400)
RBC: 4.58 MIL/uL (ref 3.87–5.11)
RDW: 11.9 % (ref 11.5–15.5)
WBC Count: 6.9 10*3/uL (ref 4.0–10.5)
nRBC: 0 % (ref 0.0–0.2)

## 2022-04-11 LAB — CMP (CANCER CENTER ONLY)
ALT: 18 U/L (ref 0–44)
AST: 22 U/L (ref 15–41)
Albumin: 4.3 g/dL (ref 3.5–5.0)
Alkaline Phosphatase: 63 U/L (ref 38–126)
Anion gap: 10 (ref 5–15)
BUN: 12 mg/dL (ref 8–23)
CO2: 25 mmol/L (ref 22–32)
Calcium: 9.1 mg/dL (ref 8.9–10.3)
Chloride: 105 mmol/L (ref 98–111)
Creatinine: 0.92 mg/dL (ref 0.44–1.00)
GFR, Estimated: >60 mL/min (ref >60)
Glucose, Bld: 101 mg/dL — ABNORMAL HIGH (ref 70–99)
Potassium: 4 mmol/L (ref 3.5–5.1)
Sodium: 140 mmol/L (ref 135–145)
Total Bilirubin: 0.6 mg/dL (ref 0.3–1.2)
Total Protein: 7.6 g/dL (ref 6.5–8.1)

## 2022-04-11 MED ORDER — VENLAFAXINE HCL ER 37.5 MG PO CP24: 37.5000 mg | ORAL_CAPSULE | Freq: Every day | ORAL | 3 refills | Status: DC

## 2022-04-11 NOTE — Assessment & Plan Note (Addendum)
01/18/2017: Right lumpectomy: T2N0 stage Ib ILC, grade 2, margins negative, 0/4 lymph nodes negative, ER 95%, PR 95%, Ki-67 15%, HER2 negative ratio 1.57 Oncotype DX score: 8 (risk of distant recurrence 3%) 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   Current treatment: Tamoxifen started 02/27/2017 (due to osteoporosis T score -3.2 12/12/2016) completed February 2024  She takes Effexor for mood swings/depression.      Breast cancer index indicated that she would not benefit from extended endocrine therapy.  Therefore she stopped antiestrogen therapy in 01/31/2022   Breast cancer surveillance: 1.  Breast exam 04/11/2022: Benign 2. mammogram 04/07/2022: Benign breast density category B   Return to clinic in 1 year for follow-up

## 2022-04-11 NOTE — Addendum Note (Signed)
Addended by: Serena Croissant on: 04/11/2022 09:59 AM   Modules accepted: Orders

## 2022-11-27 IMAGING — MG MM DIGITAL DIAGNOSTIC UNILAT*L* W/ TOMO W/ CAD
4 series · 4 of 12 positions shown · non-contrast
Comparison: Previous exams.

CLINICAL DATA: Screening recall for left breast asymmetry. History
of right mastectomy.

EXAM:
DIGITAL DIAGNOSTIC UNILATERAL LEFT MAMMOGRAM WITH TOMOSYNTHESIS AND
CAD
TECHNIQUE: Left digital diagnostic mammography and breast tomosynthesis was
performed. The images were evaluated with computer-aided detection.

[L ML synth-2D]
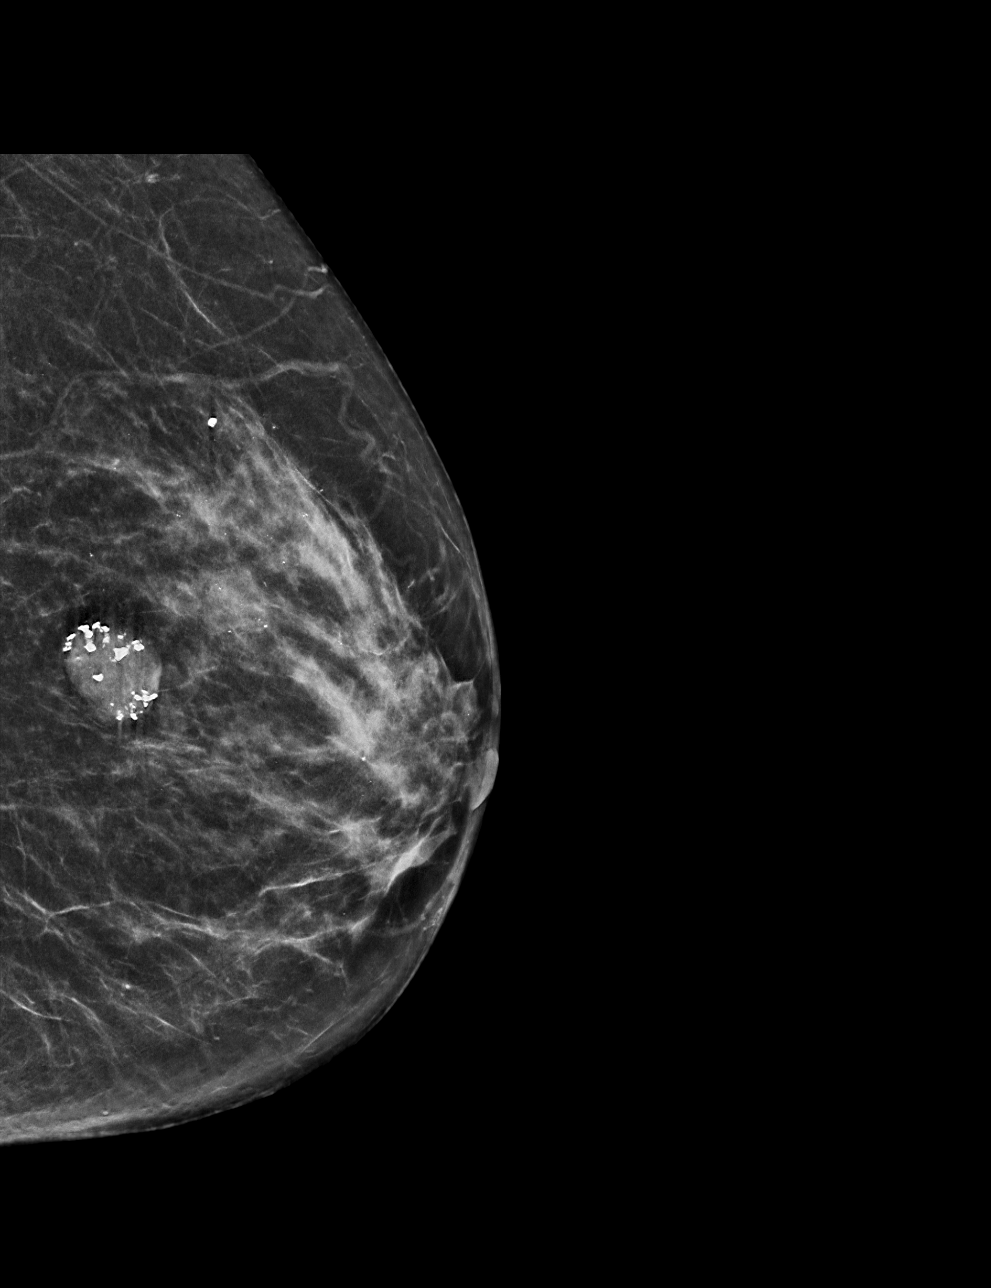

[L MLO synth-2D]
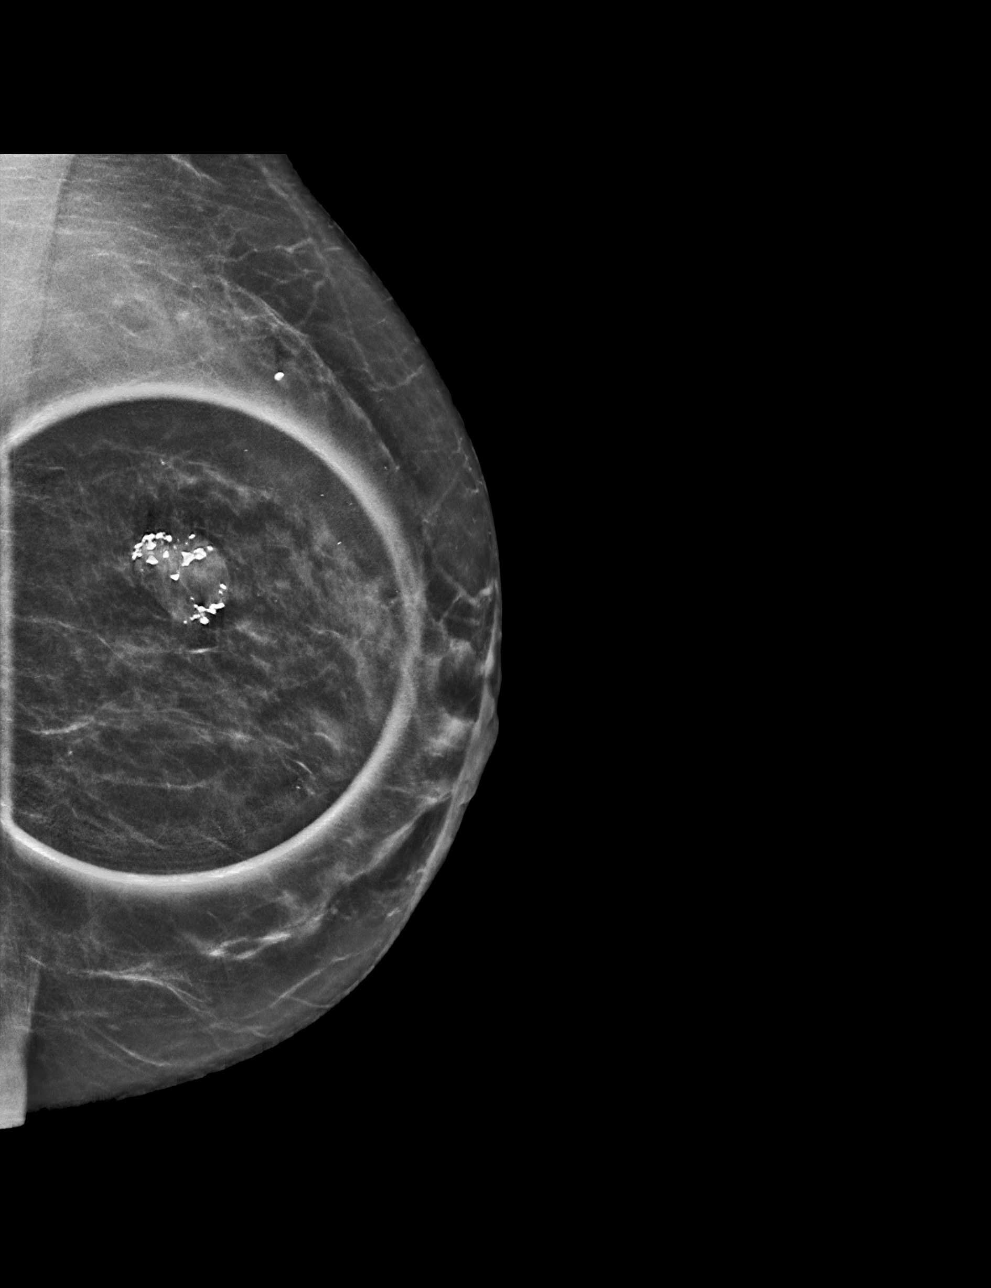

[L MLO tomo · tomo slice 31/61.0]
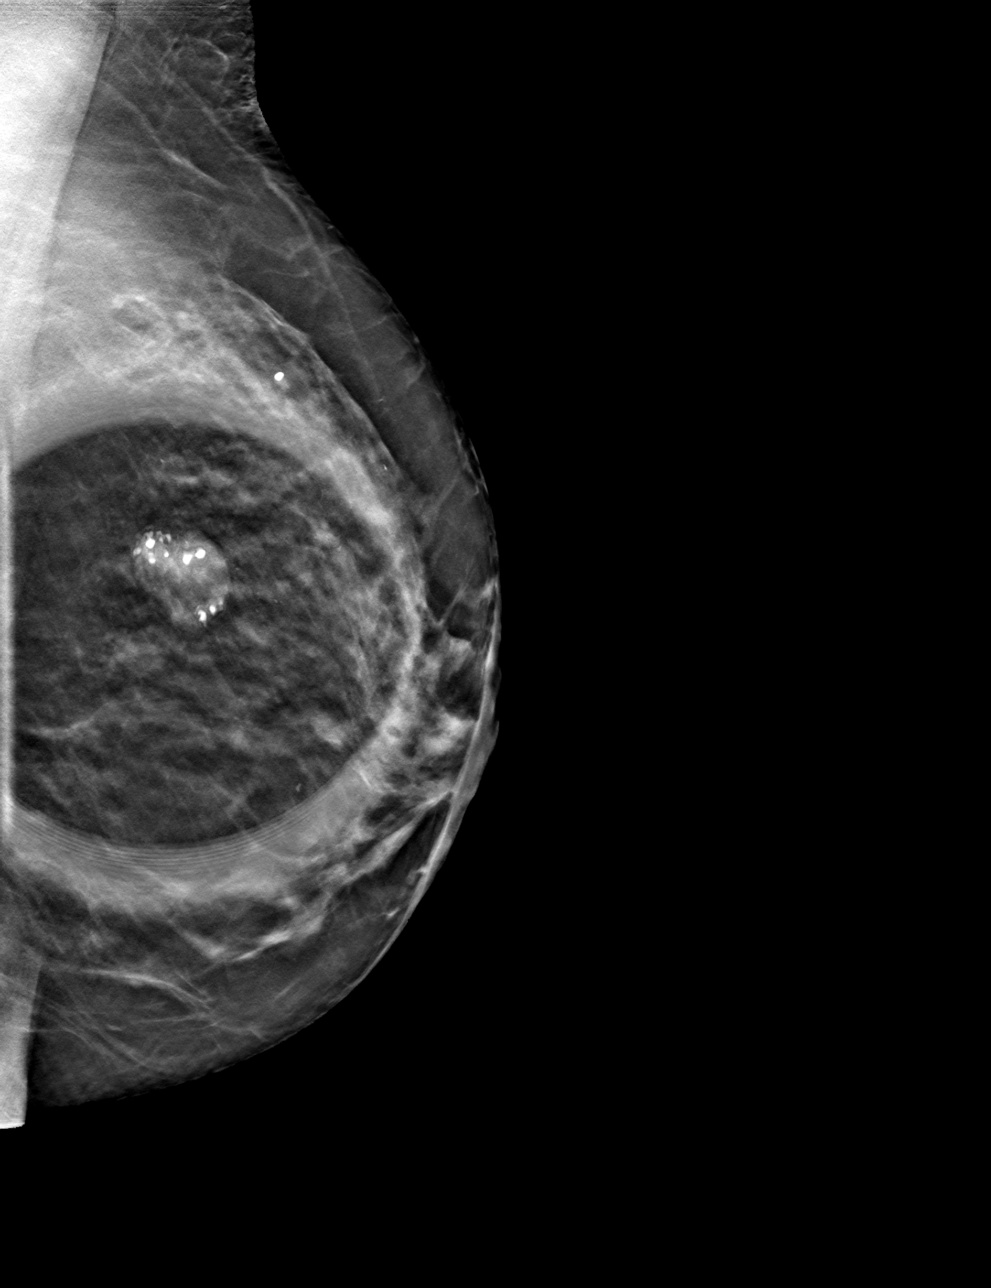

[L ML tomo · tomo slice 31/60.0]
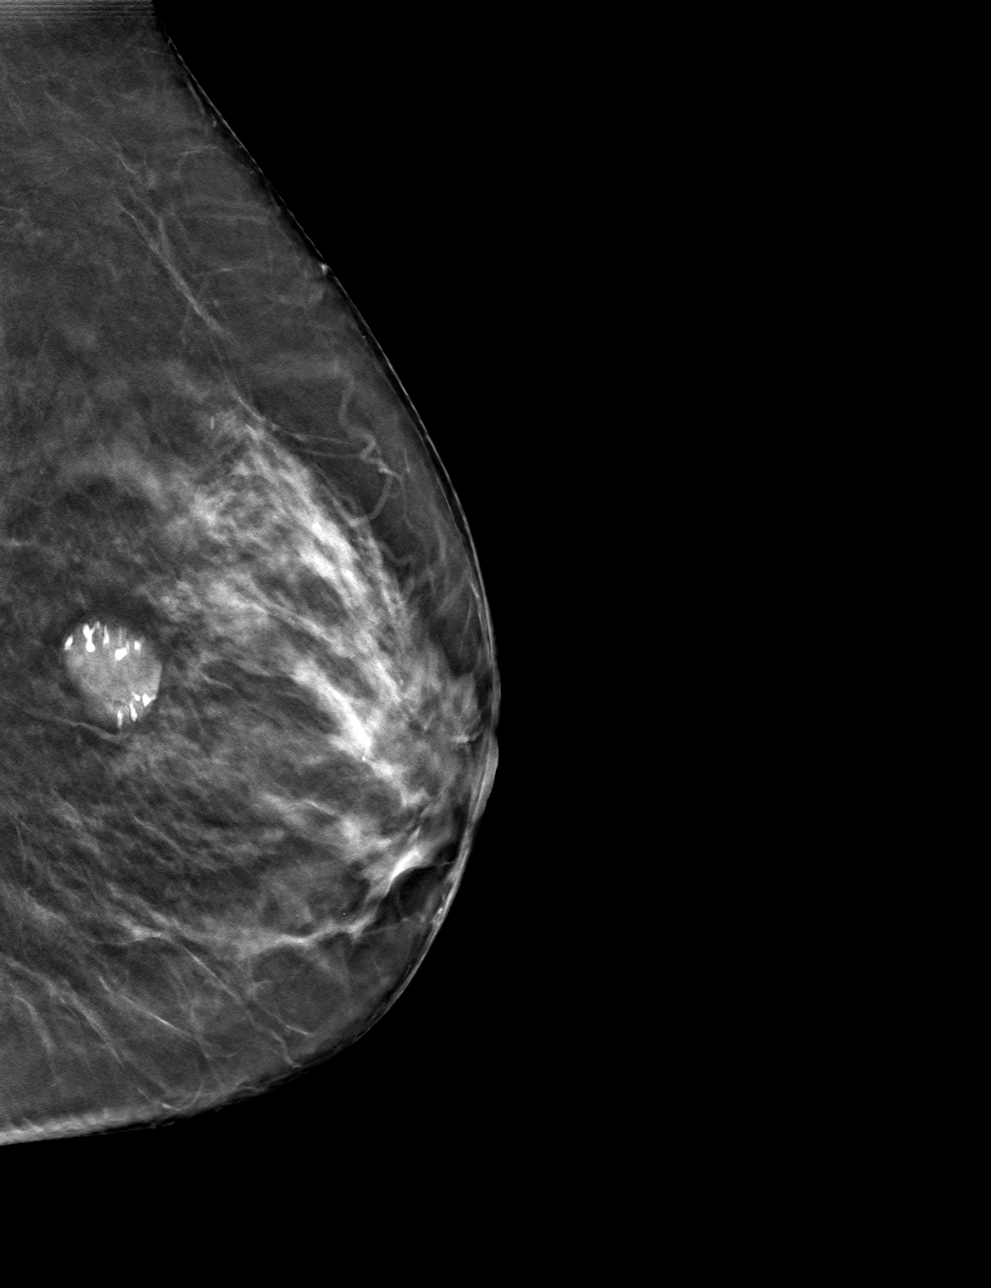

[4 of 12 positions shown; findings below may reference images not displayed]

ACR Breast Density Category c: The breast tissue is heterogeneously
dense, which may obscure small masses.
FINDINGS: Additional tomograms were performed of the left breast. The
initially questioned possible left breast asymmetry resolves on the
additional imaging with the fibroglandular pattern in the left
breast unchanged. There is no mammographic evidence of malignancy in
the left breast.
IMPRESSION: No findings of malignancy in the left breast.

RECOMMENDATION:
Screening mammogram in one year.(Code:BB-6-2K6)

I have discussed the findings and recommendations with the patient.
If applicable, a reminder letter will be sent to the patient
regarding the next appointment.

BI-RADS CATEGORY  1: Negative.

## 2023-02-24 ENCOUNTER — Other Ambulatory Visit: Payer: Self-pay | Admitting: Hematology and Oncology

## 2023-02-24 DIAGNOSIS — Z1231 Encounter for screening mammogram for malignant neoplasm of breast: Secondary | ICD-10-CM

## 2023-04-08 ENCOUNTER — Telehealth: Payer: Self-pay | Admitting: Hematology and Oncology

## 2023-04-08 NOTE — Telephone Encounter (Signed)
 Pt is aware of her scheduled annual appt.

## 2023-04-13 ENCOUNTER — Inpatient Hospital Stay: Payer: Medicare PPO | Admitting: Hematology and Oncology

## 2023-04-20 ENCOUNTER — Ambulatory Visit: Payer: Medicare PPO

## 2023-05-18 ENCOUNTER — Encounter (HOSPITAL_COMMUNITY): Payer: Self-pay

## 2023-05-25 ENCOUNTER — Ambulatory Visit
Admission: RE | Admit: 2023-05-25 | Discharge: 2023-05-25 | Disposition: A | Discharge status: Home / Self Care | Source: Ambulatory Visit | Attending: Hematology and Oncology | Admitting: Hematology and Oncology

## 2023-05-25 DIAGNOSIS — Z1231 Encounter for screening mammogram for malignant neoplasm of breast: Secondary | ICD-10-CM

## 2023-05-28 ENCOUNTER — Encounter: Payer: Self-pay | Admitting: Hematology and Oncology

## 2023-06-02 ENCOUNTER — Inpatient Hospital Stay: Attending: Hematology and Oncology | Admitting: Hematology and Oncology

## 2023-06-02 VITALS — BP 142/57 | HR 102 | Temp 97.9°F | Resp 18 | Ht 64.0 in | Wt 150.9 lb

## 2023-06-02 DIAGNOSIS — C50811 Malignant neoplasm of overlapping sites of right female breast: Secondary | ICD-10-CM

## 2023-06-02 DIAGNOSIS — Z853 Personal history of malignant neoplasm of breast: Secondary | ICD-10-CM | POA: Diagnosis present

## 2023-06-02 DIAGNOSIS — Z170 Estrogen receptor positive status [ER+]: Secondary | ICD-10-CM | POA: Diagnosis not present

## 2023-06-02 MED ORDER — VENLAFAXINE HCL ER 37.5 MG PO CP24: 37.5000 mg | ORAL_CAPSULE | Freq: Every day | ORAL | 3 refills | Status: AC

## 2023-06-02 NOTE — Assessment & Plan Note (Signed)
 01/18/2017: Right lumpectomy: T2N0 stage Ib ILC, grade 2, margins negative, 0/4 lymph nodes negative, ER 95%, PR 95%, Ki-67 15%, HER2 negative ratio 1.57 Oncotype DX score: 8 (risk of distant recurrence 3%) 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   Current treatment: Tamoxifen  started 02/27/2017 (due to osteoporosis T score -3.2 12/12/2016) completed February 2024   She takes Effexor  for mood swings/depression.      Breast cancer index indicated that she would not benefit from extended endocrine therapy.  Therefore she stopped antiestrogen therapy in 01/31/2022   Breast cancer surveillance: 1.  Breast exam 06/02/2023: Benign 2. mammogram 05/28/2023: Benign breast density category B   Return to clinic in 1 year for follow-up

## 2023-06-02 NOTE — Progress Notes (Signed)
 Patient Care Team: Lavenia Post., MD as PCP - General (Internal Medicine) Sim Dryer, MD as Consulting Physician (General Surgery) Magrinat, Rozella Cornfield, MD (Inactive) as Consulting Physician (Oncology) Steffan Edison, MD as Referring Physician (Oncology) Debbie Fails, Laura Polio, NP as Nurse Practitioner (Hematology and Oncology) Lyn Sanders, MD (Inactive) as Consulting Physician (Gynecologic Oncology)  DIAGNOSIS:  Encounter Diagnosis  Name Primary?   Malignant neoplasm of overlapping sites of right breast in female, estrogen receptor positive (HCC) Yes    SUMMARY OF ONCOLOGIC HISTORY: Oncology History  Malignant neoplasm of overlapping sites of right breast in female, estrogen receptor positive (HCC)  01/01/2017 Initial Biopsy   s/p Right breast overlapping sites biopsy x2 (different quadrants) on 01/01/2017 for a clinical mT2 N0,  Stage IB invasive lobular carcinoma, grade 2,  estrogen and progesterone receptor positive, HER-2 not amplified             (a) breast MRI on 01/16/2017 shows 2 additional areas of concern in the right breast   02/05/2017 Surgery   right mastectomy with sentinel lymph node sampling showed an mT2 N0, stage IB invasive lobular carcinoma, grade 2, with negative margins.  (Total of 4 lymph nodes removed   02/05/2017 Oncotype testing   The Oncotype DX score was 8, predicting a risk of outside the breast recurrence over the next 10 years of 3% if the patient's only systemic therapy is tamoxifen  for 5 years.  It also predicts no significant benefit from chemotherapy.   02/27/2017 -  Anti-estrogen oral therapy   tamoxifen  started 02/27/2017             (a) bone density at [facility] 12/12/2016 shows a T score of -3.2             (b) status post hysterectomy with bilateral salpingo-oophorectomy             (c) remote history of oral contraceptives with no complications   10/21/2017 Cancer Staging   Staging form: Breast, AJCC 8th Edition -  Pathologic: Stage IA (pT2(m), pN0, cM0, G2, ER+, PR+, HER2-) - Signed by Percival Brace, NP on 10/21/2017   Endometrial cancer (HCC)  02/28/2014 Initial Biopsy   D&C - gr 2 EMCA   02/28/2014 Initial Diagnosis   Endometrial cancer (HCC)   04/13/2014 Surgery   RA TLH/BSO and PLND with Dr. 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  Depth of invasion was 0.7 out of 1.4 cm.  The tumor size was 2.3 cm     CHIEF COMPLIANT: Surveillance of breast cancer  HISTORY OF PRESENT ILLNESS: History of Present Illness Doris Maynard is a 78 year old female who presents for a follow-up visit.  She was diagnosed with breast cancer in late 2018 and underwent a mastectomy in January 2019. She completed a five-year course of tamoxifen , which was discontinued approximately a year ago after a Breast Cancer Index test indicated no further benefit. A recent mammogram returned unremarkable results.     ALLERGIES:  is allergic to clindamycin.  MEDICATIONS:  Current Outpatient Medications  Medication Sig Dispense Refill   lisinopril  (PRINIVIL ,ZESTRIL ) 20 MG tablet Take 20 mg by mouth daily.     risedronate (ACTONEL) 150 MG tablet Take 150 mg by mouth every 30 (thirty) days.     venlafaxine  XR (EFFEXOR -XR) 37.5 MG 24 hr  capsule Take 1 capsule (37.5 mg total) by mouth daily with breakfast. 90 capsule 3   doxylamine, Sleep, (SLEEP AID) 25 MG tablet Take 25 mg by mouth at bedtime as needed for sleep. (Patient not taking: Reported on 06/02/2023)     No current facility-administered medications for this visit.    PHYSICAL EXAMINATION: ECOG PERFORMANCE STATUS: 1 - Symptomatic but completely ambulatory  Vitals:   06/02/23 1049  BP: (!) 142/57  Pulse: (!) 102  Resp: 18  Temp: 97.9 F (36.6 C)  SpO2: 100%   Filed Weights   06/02/23 1049  Weight:  150 lb 14.4 oz (68.4 kg)    Physical Exam No palpable lumps or nodules in the right chest wall or left breast or axilla  (exam performed in the presence of a chaperone)  LABORATORY DATA:  I have reviewed the data as listed    Latest Ref Rng & Units 04/11/2022    8:52 AM 02/25/2021   10:45 AM 01/10/2020    9:05 AM  CMP  Glucose 70 - 99 mg/dL 098  96  119   BUN 8 - 23 mg/dL 12  13  13    Creatinine 0.44 - 1.00 mg/dL 1.47  8.29  5.62   Sodium 135 - 145 mmol/L 140  141  140   Potassium 3.5 - 5.1 mmol/L 4.0  4.5  4.0   Chloride 98 - 111 mmol/L 105  106  105   CO2 22 - 32 mmol/L 25  27  25    Calcium 8.9 - 10.3 mg/dL 9.1  9.4  9.2   Total Protein 6.5 - 8.1 g/dL 7.6  7.4  7.2   Total Bilirubin 0.3 - 1.2 mg/dL 0.6  0.6  0.6   Alkaline Phos 38 - 126 U/L 63  53  46   AST 15 - 41 U/L 22  17  19    ALT 0 - 44 U/L 18  11  17      Lab Results  Component Value Date   WBC 6.9 04/11/2022   HGB 13.6 04/11/2022   HCT 42.3 04/11/2022   MCV 92.4 04/11/2022   PLT 235 04/11/2022   NEUTROABS 4.2 04/11/2022    ASSESSMENT & PLAN:  Malignant neoplasm of overlapping sites of right breast in female, estrogen receptor positive (HCC) 01/18/2017: Right lumpectomy: T2N0 stage Ib ILC, grade 2, margins negative, 0/4 lymph nodes negative, ER 95%, PR 95%, Ki-67 15%, HER2 negative ratio 1.57 Oncotype DX score: 8 (risk of distant recurrence 3%) 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   Current treatment: Tamoxifen  started 02/27/2017 (due to osteoporosis T score -3.2 12/12/2016) completed February 2024 (BCI low risk)   She takes Effexor  for mood swings/depression.   I renewed her prescription today.    Breast cancer surveillance: 1.  Breast exam 06/02/2023: Benign 2. mammogram 05/28/2023: Benign breast density category B   Return to clinic in 1 year for follow-up     No orders of the defined types were placed in this encounter.  The patient has a good understanding of the overall plan. she agrees with it. she will call  with any problems that may develop before the next visit here. Total time spent: 30 mins including face to face time and time spent for planning, charting and co-ordination of care   Viinay K Nelani Schmelzle, MD 06/02/23

## 2024-05-31 ENCOUNTER — Ambulatory Visit: Admitting: Hematology and Oncology
